# Patient Record
Sex: Female | Born: 1985 | Race: Black or African American | Hispanic: No | Marital: Married | Smoking: Never smoker
Health system: Southern US, Community
[De-identification: ages and names within clinical notes are randomized; demographics above are authoritative.]

## PROBLEM LIST (undated history)

## (undated) ENCOUNTER — Inpatient Hospital Stay (HOSPITAL_COMMUNITY): Payer: Self-pay

## (undated) DIAGNOSIS — R87629 Unspecified abnormal cytological findings in specimens from vagina: Secondary | ICD-10-CM

## (undated) DIAGNOSIS — R51 Headache: Secondary | ICD-10-CM

## (undated) DIAGNOSIS — R519 Headache, unspecified: Secondary | ICD-10-CM

## (undated) DIAGNOSIS — I10 Essential (primary) hypertension: Secondary | ICD-10-CM

## (undated) HISTORY — PX: WISDOM TOOTH EXTRACTION: SHX21

---

## 2017-03-13 ENCOUNTER — Other Ambulatory Visit: Payer: Self-pay | Admitting: Obstetrics & Gynecology

## 2017-03-13 ENCOUNTER — Other Ambulatory Visit (HOSPITAL_COMMUNITY)
Admission: RE | Admit: 2017-03-13 | Discharge: 2017-03-13 | Disposition: A | Discharge status: Home / Self Care | Payer: 59 | Source: Ambulatory Visit | Attending: Obstetrics and Gynecology | Admitting: Obstetrics and Gynecology

## 2017-03-13 DIAGNOSIS — Z3009 Encounter for other general counseling and advice on contraception: Secondary | ICD-10-CM | POA: Diagnosis not present

## 2017-03-13 DIAGNOSIS — Z01419 Encounter for gynecological examination (general) (routine) without abnormal findings: Secondary | ICD-10-CM | POA: Insufficient documentation

## 2017-03-14 LAB — CYTOLOGY - PAP: Diagnosis: NEGATIVE

## 2017-05-13 DIAGNOSIS — L308 Other specified dermatitis: Secondary | ICD-10-CM | POA: Diagnosis not present

## 2017-05-13 DIAGNOSIS — L740 Miliaria rubra: Secondary | ICD-10-CM | POA: Diagnosis not present

## 2017-07-29 DIAGNOSIS — F4320 Adjustment disorder, unspecified: Secondary | ICD-10-CM | POA: Diagnosis not present

## 2017-08-12 DIAGNOSIS — F4320 Adjustment disorder, unspecified: Secondary | ICD-10-CM | POA: Diagnosis not present

## 2017-08-20 ENCOUNTER — Encounter (HOSPITAL_COMMUNITY): Payer: Self-pay | Admitting: Emergency Medicine

## 2017-08-20 ENCOUNTER — Ambulatory Visit (HOSPITAL_COMMUNITY)
Admission: EM | Admit: 2017-08-20 | Discharge: 2017-08-20 | Disposition: A | Discharge status: Home / Self Care | Payer: 59 | Attending: Family Medicine | Admitting: Family Medicine

## 2017-08-20 DIAGNOSIS — R6889 Other general symptoms and signs: Secondary | ICD-10-CM

## 2017-08-20 DIAGNOSIS — J069 Acute upper respiratory infection, unspecified: Secondary | ICD-10-CM | POA: Diagnosis not present

## 2017-08-20 HISTORY — DX: Essential (primary) hypertension: I10

## 2017-08-20 NOTE — ED Triage Notes (Signed)
Pt sts URI sx with fever x 2 days

## 2017-08-21 NOTE — ED Provider Notes (Signed)
  [facility]   [id number] 08/20/17 Arrival Time: 1759  ASSESSMENT & PLAN:  1. Flu-like symptoms   2. Viral upper respiratory tract infection    Rest/fluids. OTC analgesics and symptom care as needed. May f/u as needed.  Reviewed expectations re: course of current medical issues. Questions answered. Outlined signs and symptoms indicating need for more acute intervention. Patient verbalized understanding. After Visit Summary given.   SUBJECTIVE:  Kara Cherry is a 31 y.o. female who presents with complaint of nasal congestion, post-nasal drainage, bodyaches, and coughing. Abrupt onset yesterday. Overall fatigued. Subjective fever. No SOB/wheezing. No OTC treatment.  ROS: As per HPI.   OBJECTIVE:  Vitals:   08/20/17 1819 08/20/17 1923  BP: (!) 144/100 (!) 136/92  Pulse: 66 77  Resp: 18   Temp: 98.8 F (37.1 C)   TempSrc: Oral   SpO2: 99%      General appearance: alert; no distress HEENT: nasal congestion; clear runny nose; throat irritation secondary to post-nasal drainage Neck: supple without LAD Lungs: clear to auscultation bilaterally Skin: warm and dry Psychological: alert and cooperative; normal mood and affect    Allergies  Allergen Reactions  . Sulfa Antibiotics     Past Medical History:  Diagnosis Date  . Hypertension    Social History   Social History  . Marital status: Married    Spouse name: N/A  . Number of children: N/A  . Years of education: N/A   Occupational History  . Not on file.   Social History Main Topics  . Smoking status: Never Smoker  . Smokeless tobacco: Never Used  . Alcohol use Yes     Comment: occ  . Drug use: No  . Sexual activity: Not on file   Other Topics Concern  . Not on file   Social History Narrative  . No narrative on file            Mardella Layman, MD 08/21/17 [phone]

## 2017-09-16 DIAGNOSIS — F4320 Adjustment disorder, unspecified: Secondary | ICD-10-CM | POA: Diagnosis not present

## 2017-10-07 ENCOUNTER — Other Ambulatory Visit: Payer: Self-pay

## 2017-10-07 ENCOUNTER — Encounter: Payer: Self-pay | Admitting: Family Medicine

## 2017-10-07 ENCOUNTER — Ambulatory Visit (INDEPENDENT_AMBULATORY_CARE_PROVIDER_SITE_OTHER): Payer: BLUE CROSS/BLUE SHIELD | Admitting: Family Medicine

## 2017-10-07 VITALS — BP 118/74 | HR 84 | Temp 98.5°F | Resp 17 | Ht 66.75 in | Wt 147.6 lb

## 2017-10-07 DIAGNOSIS — Z0000 Encounter for general adult medical examination without abnormal findings: Secondary | ICD-10-CM

## 2017-10-07 DIAGNOSIS — K582 Mixed irritable bowel syndrome: Secondary | ICD-10-CM

## 2017-10-07 DIAGNOSIS — R002 Palpitations: Secondary | ICD-10-CM | POA: Diagnosis not present

## 2017-10-07 DIAGNOSIS — Z13220 Encounter for screening for lipoid disorders: Secondary | ICD-10-CM

## 2017-10-07 NOTE — Patient Instructions (Addendum)

## 2017-10-07 NOTE — Progress Notes (Signed)
Chief Complaint  Patient presents with  . Annual Exam    no pap    Subjective:  Kara Cherry is a 31 y.o. female here for a health maintenance visit.  Patient is new pt  There are no active problems to display for this patient.   Past Medical History:  Diagnosis Date  . Hypertension     History reviewed. No pertinent surgical history.   Outpatient Medications Prior to Visit  Medication Sig Dispense Refill  . Cyanocobalamin (VITAMIN B 12 PO) Take 5,000 mcg/day by mouth.    . Prenatal Vit-Fe Fumarate-FA (MULTIVITAMIN-PRENATAL) 27-0.8 MG TABS tablet Take 1 tablet by mouth daily at 12 noon.     No facility-administered medications prior to visit.     Allergies  Allergen Reactions  . Sulfa Antibiotics      Family History  Problem Relation Age of Onset  . Hypertension Mother   . Hypertension Father   . Heart disease Paternal Grandmother   . Stroke Paternal Grandmother   . Diabetes Paternal Grandfather   . Heart disease Paternal Grandfather      Health Habits: Dental Exam: up to date Eye Exam: up to date Exercise: 2 times/week on average Current exercise activities: walking/running Diet: balanced  Social History   Socioeconomic History  . Marital status: Married    Spouse name: Not on file  . Number of children: Not on file  . Years of education: Not on file  . Highest education level: Not on file  Social Needs  . Financial resource strain: Not on file  . Food insecurity - worry: Not on file  . Food insecurity - inability: Not on file  . Transportation needs - medical: Not on file  . Transportation needs - non-medical: Not on file  Occupational History  . Not on file  Tobacco Use  . Smoking status: Never Smoker  . Smokeless tobacco: Never Used  Substance and Sexual Activity  . Alcohol use: Yes    Comment: occ  . Drug use: No  . Sexual activity: Yes  Other Topics Concern  . Not on file  Social History Narrative  . Not on file   Social  History   Substance and Sexual Activity  Alcohol Use Yes   Comment: occ   Social History   Tobacco Use  Smoking Status Never Smoker  Smokeless Tobacco Never Used   Social History   Substance and Sexual Activity  Drug Use No    GYN: Sexual Health Menstrual status: regular menses LMP: Patient's last menstrual period was 09/27/2017. Last pap smear: see HM section History of abnormal pap smears:  Sexually active: with female partner Current contraception: none  Health Maintenance: See under health Maintenance activity for review of completion dates as well.  There is no immunization history on file for this patient.    Depression Screen-PHQ2/9 Depression screen [facility] 2/9 10/07/2017  Decreased Interest 0  Down, Depressed, Hopeless 0  PHQ - 2 Score 0    Depression Severity and Treatment Recommendations:  0-4= None  5-9= Mild / Treatment: Support, educate to call if worse; return in one month  10-14= Moderate / Treatment: Support, watchful waiting; Antidepressant or Psycotherapy  15-19= Moderately severe / Treatment: Antidepressant OR Psychotherapy  >= 20 = Major depression, severe / Antidepressant AND Psychotherapy    Review of Systems   Review of Systems  Constitutional: Negative for chills and fever.  HENT: Negative for hearing loss and tinnitus.   Eyes: Negative for blurred vision, double  vision and photophobia.  Respiratory: Negative for cough, shortness of breath and wheezing.   Cardiovascular: Positive for palpitations. Negative for chest pain.  Gastrointestinal: Positive for constipation, diarrhea and nausea. Negative for abdominal pain, blood in stool, heartburn, melena and vomiting.  Genitourinary: Negative for dysuria, frequency and urgency.  Skin: Negative for itching and rash.  Neurological: Negative for dizziness, tingling, tremors and headaches.  Psychiatric/Behavioral: Negative for depression. The patient is not nervous/anxious.     See HPI for ROS  as well.    Objective:   Vitals:   10/07/17 1050  BP: 118/74  Pulse: 84  Resp: 17  Temp: 98.5 F (36.9 C)  TempSrc: Oral  SpO2: 99%  Weight: 147 lb 9.6 oz (67 kg)  Height: 5' 6.75" (1.695 m)    Body mass index is 23.29 kg/m.  Physical Exam  BP 118/74 (BP Location: Left Arm, Patient Position: Sitting, Cuff Size: Normal)   Pulse 84   Temp 98.5 F (36.9 C) (Oral)   Resp 17   Ht 5' 6.75" (1.695 m)   Wt 147 lb 9.6 oz (67 kg)   LMP 09/27/2017   SpO2 99%   BMI 23.29 kg/m   General Appearance:    Alert, cooperative, no distress, appears stated age  Head:    Normocephalic, without obvious abnormality, atraumatic  Eyes:    PERRL, conjunctiva/corneas clear, EOM's intact, fundi    benign, both eyes  Ears:    Normal TM's and external ear canals, both ears  Nose:   Nares normal, septum midline, mucosa normal, no drainage    or sinus tenderness  Throat:   Lips, mucosa, and tongue normal; teeth and gums normal  Neck:   Supple, symmetrical, trachea midline, no adenopathy;    thyroid:  no enlargement/tenderness/nodules; no carotid   bruit or JVD  Back:     Symmetric, no curvature, ROM normal, no CVA tenderness  Lungs:     Clear to auscultation bilaterally, respirations unlabored  Chest Wall:    No tenderness or deformity   Heart:    Regular rate and rhythm, S1 and S2 normal, no murmur, rub   or gallop  Breast Exam:    No tenderness, masses, or nipple abnormality  Abdomen:     Soft, non-tender, bowel sounds active all four quadrants,    no masses, no organomegaly  Extremities:   Extremities normal, atraumatic, no cyanosis or edema  Pulses:   2+ and symmetric all extremities  Skin:   Skin color, texture, turgor normal, no rashes or lesions  Lymph nodes:   Cervical, supraclavicular, and axillary nodes normal  Neurologic:   CNII-XII intact, normal strength, sensation and reflexes    throughout   ECG - normal sinus rhythm   Assessment/Plan:   Patient was seen for a health  maintenance exam.  Counseled the patient on health maintenance issues. Reviewed her health mainteance schedule and ordered appropriate tests (see orders.) Counseled on regular exercise and weight management. Recommend regular eye exams and dental cleaning.   The following issues were addressed today for health maintenance:   Lakiya was seen today for annual exam.  Diagnoses and all orders for this visit:  Encounter for health maintenance examination in adult- age appropriate screenings reviewed  Irritable bowel syndrome with both constipation and diarrhea- advised pt to keep a food diary -     TSH -     Basic metabolic panel  Screening, lipid -     Lipid panel  Palpitations- normal ekg, advised  increased hydration and decreased caffeine  Will check for anemia  -     EKG 12-Lead -     CBC    Return in about 1 year (around 10/07/2018).    Body mass index is 23.29 kg/m.:  Discussed the patient's BMI with patient. The BMI body mass index is 23.29 kg/m.     No future appointments.  Patient Instructions  Health Maintenance, Female Adopting a healthy lifestyle and getting preventive care can go a long way to promote health and wellness. Talk with your health care provider about what schedule of regular examinations is right for you. This is a good chance for you to check in with your provider about disease prevention and staying healthy. In between checkups, there are plenty of things you can do on your own. Experts have done a lot of research about which lifestyle changes and preventive measures are most likely to keep you healthy. Ask your health care provider for more information. Weight and diet Eat a healthy diet  Be sure to include plenty of vegetables, fruits, low-fat dairy products, and lean protein.  Do not eat a lot of foods high in solid fats, added sugars, or salt.  Get regular exercise. This is one of the most important things you can do for your health. ? Most  adults should exercise for at least 150 minutes each week. The exercise should increase your heart rate and make you sweat (moderate-intensity exercise). ? Most adults should also do strengthening exercises at least twice a week. This is in addition to the moderate-intensity exercise.  Maintain a healthy weight  Body mass index (BMI) is a measurement that can be used to identify possible weight problems. It estimates body fat based on height and weight. Your health care provider can help determine your BMI and help you achieve or maintain a healthy weight.  For females 77 years of age and older: ? A BMI below 18.5 is considered underweight. ? A BMI of 18.5 to 24.9 is normal. ? A BMI of 25 to 29.9 is considered overweight. ? A BMI of 30 and above is considered obese.  Watch levels of cholesterol and blood lipids  You should start having your blood tested for lipids and cholesterol at 29 years of age, then have this test every 5 years.  You may need to have your cholesterol levels checked more often if: ? Your lipid or cholesterol levels are high. ? You are older than 29 years of age. ? You are at high risk for heart disease.  Cancer screening Lung Cancer  Lung cancer screening is recommended for adults 23-47 years old who are at high risk for lung cancer because of a history of smoking.  A yearly low-dose CT scan of the lungs is recommended for people who: ? Currently smoke. ? Have quit within the past 15 years. ? Have at least a 30-pack-year history of smoking. A pack year is smoking an average of one pack of cigarettes a day for 1 year.  Yearly screening should continue until it has been 15 years since you quit.  Yearly screening should stop if you develop a health problem that would prevent you from having lung cancer treatment.  Breast Cancer  Practice breast self-awareness. This means understanding how your breasts normally appear and feel.  It also means doing regular  breast self-exams. Let your health care provider know about any changes, no matter how small.  If you are in your 20s or 30s,  you should have a clinical breast exam (CBE) by a health care provider every 1-3 years as part of a regular health exam.  If you are 70 or older, have a CBE every year. Also consider having a breast X-ray (mammogram) every year.  If you have a family history of breast cancer, talk to your health care provider about genetic screening.  If you are at high risk for breast cancer, talk to your health care provider about having an MRI and a mammogram every year.  Breast cancer gene (BRCA) assessment is recommended for women who have family members with BRCA-related cancers. BRCA-related cancers include: ? Breast. ? Ovarian. ? Tubal. ? Peritoneal cancers.  Results of the assessment will determine the need for genetic counseling and BRCA1 and BRCA2 testing.  Cervical Cancer Your health care provider may recommend that you be screened regularly for cancer of the pelvic organs (ovaries, uterus, and vagina). This screening involves a pelvic examination, including checking for microscopic changes to the surface of your cervix (Pap test). You may be encouraged to have this screening done every 3 years, beginning at age 15.  For women ages 18-65, health care providers may recommend pelvic exams and Pap testing every 3 years, or they may recommend the Pap and pelvic exam, combined with testing for human papilloma virus (HPV), every 5 years. Some types of HPV increase your risk of cervical cancer. Testing for HPV may also be done on women of any age with unclear Pap test results.  Other health care providers may not recommend any screening for nonpregnant women who are considered low risk for pelvic cancer and who do not have symptoms. Ask your health care provider if a screening pelvic exam is right for you.  If you have had past treatment for cervical cancer or a condition that  could lead to cancer, you need Pap tests and screening for cancer for at least 20 years after your treatment. If Pap tests have been discontinued, your risk factors (such as having a new sexual partner) need to be reassessed to determine if screening should resume. Some women have medical problems that increase the chance of getting cervical cancer. In these cases, your health care provider may recommend more frequent screening and Pap tests.  Colorectal Cancer  This type of cancer can be detected and often prevented.  Routine colorectal cancer screening usually begins at 29 years of age and continues through 29 years of age.  Your health care provider may recommend screening at an earlier age if you have risk factors for colon cancer.  Your health care provider may also recommend using home test kits to check for hidden blood in the stool.  A small camera at the end of a tube can be used to examine your colon directly (sigmoidoscopy or colonoscopy). This is done to check for the earliest forms of colorectal cancer.  Routine screening usually begins at age 59.  Direct examination of the colon should be repeated every 5-10 years through 28 years of age. However, you may need to be screened more often if early forms of precancerous polyps or small growths are found.  Skin Cancer  Check your skin from head to toe regularly.  Tell your health care provider about any new moles or changes in moles, especially if there is a change in a mole's shape or color.  Also tell your health care provider if you have a mole that is larger than the size of a pencil eraser.  Always use sunscreen. Apply sunscreen liberally and repeatedly throughout the day.  Protect yourself by wearing long sleeves, pants, a wide-brimmed hat, and sunglasses whenever you are outside.  Heart disease, diabetes, and high blood pressure  High blood pressure causes heart disease and increases the risk of stroke. High blood  pressure is more likely to develop in: ? People who have blood pressure in the high end of the normal range (130-139/85-89 mm Hg). ? People who are overweight or obese. ? People who are African American.  If you are 7-79 years of age, have your blood pressure checked every 3-5 years. If you are 55 years of age or older, have your blood pressure checked every year. You should have your blood pressure measured twice-once when you are at a hospital or clinic, and once when you are not at a hospital or clinic. Record the average of the two measurements. To check your blood pressure when you are not at a hospital or clinic, you can use: ? An automated blood pressure machine at a pharmacy. ? A home blood pressure monitor.  If you are between 45 years and 28 years old, ask your health care provider if you should take aspirin to prevent strokes.  Have regular diabetes screenings. This involves taking a blood sample to check your fasting blood sugar level. ? If you are at a normal weight and have a low risk for diabetes, have this test once every three years after 29 years of age. ? If you are overweight and have a high risk for diabetes, consider being tested at a younger age or more often. Preventing infection Hepatitis B  If you have a higher risk for hepatitis B, you should be screened for this virus. You are considered at high risk for hepatitis B if: ? You were born in a country where hepatitis B is common. Ask your health care provider which countries are considered high risk. ? Your parents were born in a high-risk country, and you have not been immunized against hepatitis B (hepatitis B vaccine). ? You have HIV or AIDS. ? You use needles to inject street drugs. ? You live with someone who has hepatitis B. ? You have had sex with someone who has hepatitis B. ? You get hemodialysis treatment. ? You take certain medicines for conditions, including cancer, organ transplantation, and autoimmune  conditions.  Hepatitis C  Blood testing is recommended for: ? Everyone born from [birth year] through 1963. ? Anyone with known risk factors for hepatitis C.  Sexually transmitted infections (STIs)  You should be screened for sexually transmitted infections (STIs) including gonorrhea and chlamydia if: ? You are sexually active and are younger than 29 years of age. ? You are older than 29 years of age and your health care provider tells you that you are at risk for this type of infection. ? Your sexual activity has changed since you were last screened and you are at an increased risk for chlamydia or gonorrhea. Ask your health care provider if you are at risk.  If you do not have HIV, but are at risk, it may be recommended that you take a prescription medicine daily to prevent HIV infection. This is called pre-exposure prophylaxis (PrEP). You are considered at risk if: ? You are sexually active and do not regularly use condoms or know the HIV status of your partner(s). ? You take drugs by injection. ? You are sexually active with a partner who has HIV.  Talk with  your health care provider about whether you are at high risk of being infected with HIV. If you choose to begin PrEP, you should first be tested for HIV. You should then be tested every 3 months for as long as you are taking PrEP. Pregnancy  If you are premenopausal and you may become pregnant, ask your health care provider about preconception counseling.  If you may become pregnant, take 400 to 800 micrograms (mcg) of folic acid every day.  If you want to prevent pregnancy, talk to your health care provider about birth control (contraception). Osteoporosis and menopause  Osteoporosis is a disease in which the bones lose minerals and strength with aging. This can result in serious bone fractures. Your risk for osteoporosis can be identified using a bone density scan.  If you are 55 years of age or older, or if you are at risk for  osteoporosis and fractures, ask your health care provider if you should be screened.  Ask your health care provider whether you should take a calcium or vitamin D supplement to lower your risk for osteoporosis.  Menopause may have certain physical symptoms and risks.  Hormone replacement therapy may reduce some of these symptoms and risks. Talk to your health care provider about whether hormone replacement therapy is right for you. Follow these instructions at home:  Schedule regular health, dental, and eye exams.  Stay current with your immunizations.  Do not use any tobacco products including cigarettes, chewing tobacco, or electronic cigarettes.  If you are pregnant, do not drink alcohol.  If you are breastfeeding, limit how much and how often you drink alcohol.  Limit alcohol intake to no more than 1 drink per day for nonpregnant women. One drink equals 12 ounces of beer, 5 ounces of wine, or 1 ounces of hard liquor.  Do not use street drugs.  Do not share needles.  Ask your health care provider for help if you need support or information about quitting drugs.  Tell your health care provider if you often feel depressed.  Tell your health care provider if you have ever been abused or do not feel safe at home. This information is not intended to replace advice given to you by your health care provider. Make sure you discuss any questions you have with your health care provider. Document Released: 05/21/2011 Document Revised: 04/12/2016 Document Reviewed: 08/09/2015 Elsevier Interactive Patient Education  [employer].

## 2017-10-08 LAB — CBC
Hematocrit: 41.9 % (ref 34.0–46.6)
Hemoglobin: 14.3 g/dL (ref 11.1–15.9)
MCH: 29.2 pg (ref 26.6–33.0)
MCHC: 34.1 g/dL (ref 31.5–35.7)
MCV: 86 fL (ref 79–97)
Platelets: 220 10*3/uL (ref 150–379)
RBC: 4.89 x10E6/uL (ref 3.77–5.28)
RDW: 13.9 % (ref 12.3–15.4)
WBC: 5.1 10*3/uL (ref 3.4–10.8)

## 2017-10-08 LAB — BASIC METABOLIC PANEL
BUN/Creatinine Ratio: 10 (ref 9–23)
BUN: 10 mg/dL (ref 6–20)
CO2: 25 mmol/L (ref 20–29)
Calcium: 9.7 mg/dL (ref 8.7–10.2)
Chloride: 104 mmol/L (ref 96–106)
Creatinine, Ser: 1.01 mg/dL — ABNORMAL HIGH (ref 0.57–1.00)
GFR calc Af Amer: 87 mL/min/{1.73_m2} (ref >59)
GFR calc non Af Amer: 75 mL/min/{1.73_m2} (ref >59)
Glucose: 85 mg/dL (ref 65–99)
Potassium: 4.9 mmol/L (ref 3.5–5.2)
Sodium: 143 mmol/L (ref 134–144)

## 2017-10-08 LAB — LIPID PANEL
Chol/HDL Ratio: 2.9 ratio (ref 0.0–4.4)
Cholesterol, Total: 177 mg/dL (ref 100–199)
HDL: 61 mg/dL (ref >39)
LDL Calculated: 101 mg/dL — ABNORMAL HIGH (ref 0–99)
Triglycerides: 73 mg/dL (ref 0–149)
VLDL Cholesterol Cal: 15 mg/dL (ref 5–40)

## 2017-10-08 LAB — TSH: TSH: 0.798 u[IU]/mL (ref 0.450–4.500)

## 2017-10-14 DIAGNOSIS — F4320 Adjustment disorder, unspecified: Secondary | ICD-10-CM | POA: Diagnosis not present

## 2017-10-31 DIAGNOSIS — F4320 Adjustment disorder, unspecified: Secondary | ICD-10-CM | POA: Diagnosis not present

## 2017-12-12 DIAGNOSIS — F4320 Adjustment disorder, unspecified: Secondary | ICD-10-CM | POA: Diagnosis not present

## 2018-02-06 DIAGNOSIS — R350 Frequency of micturition: Secondary | ICD-10-CM | POA: Diagnosis not present

## 2018-02-13 DIAGNOSIS — R102 Pelvic and perineal pain: Secondary | ICD-10-CM | POA: Diagnosis not present

## 2018-07-03 ENCOUNTER — Other Ambulatory Visit: Payer: Self-pay

## 2018-07-03 ENCOUNTER — Ambulatory Visit: Payer: 59 | Admitting: Physician Assistant

## 2018-07-03 VITALS — BP 135/95 | HR 75 | Temp 98.9°F | Resp 16 | Ht 66.0 in | Wt 156.0 lb

## 2018-07-03 DIAGNOSIS — L700 Acne vulgaris: Secondary | ICD-10-CM

## 2018-07-03 MED ORDER — CLINDAMYCIN PHOS-BENZOYL PEROX 1-5 % EX GEL: Freq: Two times a day (BID) | CUTANEOUS | 11 refills | Status: DC

## 2018-07-03 NOTE — Patient Instructions (Signed)
  I will contact you with your lab results within the next 2 weeks.  If you have not heard from us then please contact us. The fastest way to get your results is to register for My Chart.   IF you received an x-ray today, you will receive an invoice from [facility]. Please contact [facility] at [phone] with questions or concerns regarding your invoice.   IF you received labwork today, you will receive an invoice from [facility]. Please contact [facility] at [phone] with questions or concerns regarding your invoice.   Our billing staff will not be able to assist you with questions regarding bills from these companies.  You will be contacted with the lab results as soon as they are available. The fastest way to get your results is to activate your My Chart account. Instructions are located on the last page of this paperwork. If you have not heard from us regarding the results in 2 weeks, please contact this office.     

## 2018-07-03 NOTE — Progress Notes (Signed)
07/04/2018 9:19 AM   DOB: 08/20/1986 / MRN: [id number]  SUBJECTIVE:  Kara Cherry is a 32 y.o. female presenting for acne treatment. Symptoms present for years.  The problem is waxing and waning.  She has tried several OTC including benzoil peroxide. She is try to become pregnant.  She wants to try clindamycin/benzoil peroxide gel.  She is allergic to sulfa antibiotics.   She  has a past medical history of Hypertension.    She  reports that she has never smoked. She has never used smokeless tobacco. She reports that she drinks alcohol. She reports that she does not use drugs. She  reports that she currently engages in sexual activity. The patient  has no past surgical history on file.  Her family history includes Diabetes in her paternal grandfather; Heart disease in her paternal grandfather and paternal grandmother; Hypertension in her father and mother; Stroke in her paternal grandmother.  Review of Systems  Skin: Positive for rash. Negative for itching.    The problem list and medications were reviewed and updated by myself where necessary and exist elsewhere in the encounter.   OBJECTIVE:  BP (!) 135/95   Pulse 75   Temp 98.9 F (37.2 C) (Oral)   Resp 16   Ht 5\' 6"  (1.676 m)   Wt 156 lb (70.8 kg)   LMP 06/14/2018   SpO2 98%   BMI 25.18 kg/m   Wt Readings from Last 3 Encounters:  07/03/18 156 lb (70.8 kg)  10/07/17 147 lb 9.6 oz (67 kg)   Temp Readings from Last 3 Encounters:  07/03/18 98.9 F (37.2 C) (Oral)  10/07/17 98.5 F (36.9 C) (Oral)  08/20/17 98.8 F (37.1 C) (Oral)   BP Readings from Last 3 Encounters:  07/03/18 (!) 135/95  10/07/17 118/74  08/20/17 (!) 136/92   Pulse Readings from Last 3 Encounters:  07/03/18 75  10/07/17 84  08/20/17 77    Physical Exam  Constitutional: She is oriented to person, place, and time. She appears well-nourished. No distress.  Eyes: Pupils are equal, round, and reactive to light. EOM are normal.    Cardiovascular: Normal rate.  Pulmonary/Chest: Effort normal.  Abdominal: She exhibits no distension.  Neurological: She is alert and oriented to person, place, and time. No cranial nerve deficit. Gait normal.  Skin: Skin is dry. She is not diaphoretic.  Psychiatric: She has a normal mood and affect.  Vitals reviewed.        No results found for: HGBA1C  Lab Results  Component Value Date   WBC 5.1 10/07/2017   HGB 14.3 10/07/2017   HCT 41.9 10/07/2017   MCV 86 10/07/2017   PLT 220 10/07/2017    Lab Results  Component Value Date   CREATININE 1.01 (H) 10/07/2017   BUN 10 10/07/2017   NA 143 10/07/2017   K 4.9 10/07/2017   CL 104 10/07/2017   CO2 25 10/07/2017    No results found for: ALT, AST, GGT, ALKPHOS, BILITOT  Lab Results  Component Value Date   TSH 0.798 10/07/2017    Lab Results  Component Value Date   CHOL 177 10/07/2017   HDL 61 10/07/2017   LDLCALC 101 (H) 10/07/2017   TRIG 73 10/07/2017   CHOLHDL 2.9 10/07/2017     ASSESSMENT AND PLAN:  Leeroy BockChelsea was seen today for acne.  Diagnoses and all orders for this visit:  Acne vulgaris -     clindamycin-benzoyl peroxide (BENZACLIN) gel; Apply topically 2 (two) times  daily. -     Ambulatory referral to Dermatology    The patient is advised to call or return to clinic if she does not see an improvement in symptoms, or to seek the care of the closest emergency department if she worsens with the above plan.   Deliah BostonMichael Analyse Angst, MHS, PA-C Primary Care at [facility] 07/04/2018 9:19 AM

## 2018-07-07 ENCOUNTER — Telehealth: Payer: Self-pay | Admitting: Physician Assistant

## 2018-07-07 NOTE — Telephone Encounter (Signed)
The pt's RX for Clindamycin-Benzoyl Perox 1-5% was rejected. Notes are in box.  Please advise.  Thank you!

## 2018-07-16 ENCOUNTER — Other Ambulatory Visit: Payer: Self-pay | Admitting: Physician Assistant

## 2018-07-16 MED ORDER — CLINDAMYCIN PHOSPHATE 1 % EX GEL: Freq: Two times a day (BID) | CUTANEOUS | 11 refills | Status: DC

## 2018-07-16 NOTE — Telephone Encounter (Signed)
I will send in for just the clindamycin gel.  Maybe that will get paid for. Please make her aware. Deliah BostonMichael Clark, MS, PA-C 2:05 PM, 07/16/2018

## 2018-07-17 DIAGNOSIS — Z3009 Encounter for other general counseling and advice on contraception: Secondary | ICD-10-CM | POA: Diagnosis not present

## 2018-07-17 DIAGNOSIS — N926 Irregular menstruation, unspecified: Secondary | ICD-10-CM | POA: Diagnosis not present

## 2018-07-17 DIAGNOSIS — Z3201 Encounter for pregnancy test, result positive: Secondary | ICD-10-CM | POA: Diagnosis not present

## 2018-07-31 DIAGNOSIS — Z3401 Encounter for supervision of normal first pregnancy, first trimester: Secondary | ICD-10-CM | POA: Diagnosis not present

## 2018-07-31 DIAGNOSIS — Z3400 Encounter for supervision of normal first pregnancy, unspecified trimester: Secondary | ICD-10-CM | POA: Diagnosis not present

## 2018-07-31 DIAGNOSIS — Z3201 Encounter for pregnancy test, result positive: Secondary | ICD-10-CM | POA: Diagnosis not present

## 2018-07-31 LAB — HIV ANTIBODY (ROUTINE TESTING W REFLEX): HIV Screen 4th Generation wRfx: NONREACTIVE

## 2018-07-31 LAB — OB RESULTS CONSOLE HEPATITIS B SURFACE ANTIGEN: Hepatitis B Surface Ag: NEGATIVE

## 2018-07-31 LAB — OB RESULTS CONSOLE RUBELLA ANTIBODY, IGM: Rubella: IMMUNE

## 2018-08-01 ENCOUNTER — Telehealth: Payer: Self-pay | Admitting: Physician Assistant

## 2018-08-01 NOTE — Telephone Encounter (Signed)
Patient was denied her medication (Clindamycin-Benzoyl) by her insurance company. Denial letter is in Dr. Adela GlimpseSantiago's box.

## 2018-08-25 DIAGNOSIS — N898 Other specified noninflammatory disorders of vagina: Secondary | ICD-10-CM | POA: Diagnosis not present

## 2018-08-25 DIAGNOSIS — Z3401 Encounter for supervision of normal first pregnancy, first trimester: Secondary | ICD-10-CM | POA: Diagnosis not present

## 2018-08-25 DIAGNOSIS — Z23 Encounter for immunization: Secondary | ICD-10-CM | POA: Diagnosis not present

## 2018-08-25 LAB — OB RESULTS CONSOLE GC/CHLAMYDIA
Chlamydia: NEGATIVE
Gonorrhea: NEGATIVE

## 2018-08-27 DIAGNOSIS — Z3A10 10 weeks gestation of pregnancy: Secondary | ICD-10-CM | POA: Diagnosis not present

## 2018-09-25 DIAGNOSIS — Z3402 Encounter for supervision of normal first pregnancy, second trimester: Secondary | ICD-10-CM | POA: Diagnosis not present

## 2018-10-23 DIAGNOSIS — Z3402 Encounter for supervision of normal first pregnancy, second trimester: Secondary | ICD-10-CM | POA: Diagnosis not present

## 2018-11-17 ENCOUNTER — Telehealth: Payer: Self-pay | Admitting: Family Medicine

## 2018-11-17 NOTE — Telephone Encounter (Signed)
Copied from CRM [phone]. Topic: Quick Communication - Rx Refill/Question >> Nov 17, 2018  4:09 PM Darletta MollLander, Lumin L wrote: Medication: clindamycin (CLINDAGEL) 1 % gel   Has the patient contacted their pharmacy? Yes.   (Agent: If no, request that the patient contact the pharmacy for the refill.) (Agent: If yes, when and what did the pharmacy advise?) CONTACTED OFFICE FOR PA ALREADY  Preferred Pharmacy (with phone number or street name): [id number] [place], [place] - [address] [place] [id number] Phone: [phone] Fax: [phone]  Agent: Please be advised that RX refills may take up to 3 business days. We ask that you follow-up with your pharmacy.

## 2018-11-29 ENCOUNTER — Other Ambulatory Visit: Payer: Self-pay

## 2018-11-29 ENCOUNTER — Ambulatory Visit (HOSPITAL_COMMUNITY)
Admission: EM | Admit: 2018-11-29 | Discharge: 2018-11-29 | Disposition: A | Discharge status: Home / Self Care | Payer: 59 | Attending: Family Medicine | Admitting: Family Medicine

## 2018-11-29 ENCOUNTER — Encounter (HOSPITAL_COMMUNITY): Payer: Self-pay | Admitting: Emergency Medicine

## 2018-11-29 DIAGNOSIS — J069 Acute upper respiratory infection, unspecified: Secondary | ICD-10-CM

## 2018-11-29 DIAGNOSIS — B9789 Other viral agents as the cause of diseases classified elsewhere: Secondary | ICD-10-CM

## 2018-11-29 NOTE — Discharge Instructions (Signed)
You likely having a viral upper respiratory infection. We recommended symptom control. I expect your symptoms to start improving in the next 1-2 weeks.   1. Take a daily allergy pill/anti-histamine like Zyrtec, Claritin, or Store brand consistently for 2 weeks  2. For congestion you may try an oral decongestant like Mucinex or sudafed. You may also try intranasal flonase nasal spray or saline irrigations (neti pot, sinus cleanse)  3. For your sore throat you may try cepacol lozenges, salt water gargles, throat spray. Treatment of congestion may also help your sore throat.  4. For cough you may try Robitussen, Mucinex DM  5. Take Tylenol  to help with fever/pain/inflammation  6. Stay hydrated, drink plenty of fluids to keep throat coated and less irritated  Honey Tea For cough/sore throat try using a honey-based tea. Use 3 teaspoons of honey with juice squeezed from half lemon. Place shaved pieces of ginger into 1/2-1 cup of water and warm over stove top. Then mix the ingredients and repeat every 4 hours as needed.

## 2018-11-29 NOTE — ED Triage Notes (Signed)
The patient presented to the [facility] with a complaint of a cough and congestion x 4 days.

## 2018-11-30 NOTE — ED Provider Notes (Signed)
[facility]    CSN: [id number] Arrival date & time: 11/29/18  1341     History   Chief Complaint Chief Complaint  Patient presents with  . Cough    HPI Kara Cherry is a 33 y.o. female currently [redacted] weeks pregnant presenting today for evaluation of URI symptoms.  Patient has had cough and congestion for approximately 4 days.  She noted a fever initially with associated body aches.  Since these have resolved, but feels that slightly returned today.  She is been taking Robitussin, Tylenol and Zicam.  She denies any shortness of breath or chest discomfort.  HPI  Past Medical History:  Diagnosis Date  . Hypertension     There are no active problems to display for this patient.   History reviewed. No pertinent surgical history.  OB History    Gravida  1   Para      Term      Preterm      AB      Living        SAB      TAB      Ectopic      Multiple      Live Births               Home Medications    Prior to Admission medications   Medication Sig Start Date End Date Taking? Authorizing Provider  Prenatal Vit-Fe Fumarate-FA (MULTIVITAMIN-PRENATAL) 27-0.8 MG TABS tablet Take 1 tablet by mouth daily at 12 noon.   Yes [provider]    Family History Family History  Problem Relation Age of Onset  . Hypertension Mother   . Hypertension Father   . Heart disease Paternal Grandmother   . Stroke Paternal Grandmother   . Diabetes Paternal Grandfather   . Heart disease Paternal Grandfather     Social History Social History   Tobacco Use  . Smoking status: Never Smoker  . Smokeless tobacco: Never Used  Substance Use Topics  . Alcohol use: Yes    Comment: occ  . Drug use: No     Allergies   Sulfa antibiotics   Review of Systems Review of Systems  Constitutional: Positive for fatigue. Negative for activity change, appetite change, chills and fever.  HENT: Positive for congestion and rhinorrhea. Negative for ear  pain, sinus pressure, sore throat and trouble swallowing.   Eyes: Negative for discharge and redness.  Respiratory: Positive for cough. Negative for chest tightness and shortness of breath.   Cardiovascular: Negative for chest pain.  Gastrointestinal: Negative for abdominal pain, diarrhea, nausea and vomiting.  Musculoskeletal: Negative for myalgias.  Skin: Negative for rash.  Neurological: Negative for dizziness, light-headedness and headaches.     Physical Exam Triage Vital Signs ED Triage Vitals  Enc Vitals Group     BP 11/29/18 1455 (!) 146/88     Pulse Rate 11/29/18 1455 (!) 116     Resp 11/29/18 1455 18     Temp 11/29/18 1455 99.9 F (37.7 C)     Temp Source 11/29/18 1455 Oral     SpO2 11/29/18 1455 100 %     Weight --      Height --      Head Circumference --      Peak Flow --      Pain Score 11/29/18 1453 0     Pain Loc --      Pain Edu? --      Excl. in GC? --  No data found.  Updated Vital Signs BP (!) 146/88 (BP Location: Right Arm)   Pulse (!) 116   Temp 99.9 F (37.7 C) (Oral)   Resp 18   LMP 06/14/2018   SpO2 100%  Blood pressure rechecked 119/85 Visual Acuity Right Eye Distance:   Left Eye Distance:   Bilateral Distance:    Right Eye Near:   Left Eye Near:    Bilateral Near:     Physical Exam Vitals signs and nursing note reviewed.  Constitutional:      General: She is not in acute distress.    Appearance: She is well-developed.  HENT:     Head: Normocephalic and atraumatic.     Ears:     Comments: Bilateral ears without tenderness to palpation of external auricle, tragus and mastoid, EAC's without erythema or swelling, TM's with good bony landmarks and cone of light. Non erythematous.    Mouth/Throat:     Comments: Oral mucosa pink and moist, no tonsillar enlargement or exudate. Posterior pharynx patent and nonerythematous, no uvula deviation or swelling. Normal phonation. Eyes:     Conjunctiva/sclera: Conjunctivae normal.  Neck:      Musculoskeletal: Neck supple.  Cardiovascular:     Rate and Rhythm: Regular rhythm. Tachycardia present.     Heart sounds: No murmur.  Pulmonary:     Effort: Pulmonary effort is normal. No respiratory distress.     Breath sounds: Normal breath sounds.     Comments: Breathing comfortably at rest, CTABL, no wheezing, rales or other adventitious sounds auscultated Abdominal:     Palpations: Abdomen is soft.     Tenderness: There is no abdominal tenderness.  Skin:    General: Skin is warm and dry.  Neurological:     Mental Status: She is alert.      UC Treatments / Results  Labs (all labs ordered are listed, but only abnormal results are displayed) Labs Reviewed - No data to display  EKG None  Radiology No results found.  Procedures Procedures (including critical care time)  Medications Ordered in UC Medications - No data to display  Initial Impression / Assessment and Plan / UC Course  I have reviewed the triage vital signs and the nursing notes.  Pertinent labs & imaging results that were available during my care of the patient were reviewed by me and considered in my medical decision making (see chart for details).     Patient with 4 days of URI symptoms, most likely viral etiology, patient has low-grade temp and tachycardic.  Patient outside of Tamiflu window.  Discussed with patient possible flu.  Recommend continued symptomatic and supportive management.  Continue to keep a close eye on temperature, breathing, symptoms.  Provided list of medicines safe to take in pregnancy.Discussed strict return precautions. Patient verbalized understanding and is agreeable with plan.  Final Clinical Impressions(s) / UC Diagnoses   Final diagnoses:  Viral URI with cough     Discharge Instructions     You likely having a viral upper respiratory infection. We recommended symptom control. I expect your symptoms to start improving in the next 1-2 weeks.   1. Take a daily allergy  pill/anti-histamine like Zyrtec, Claritin, or Store brand consistently for 2 weeks  2. For congestion you may try an oral decongestant like Mucinex or sudafed. You may also try intranasal flonase nasal spray or saline irrigations (neti pot, sinus cleanse)  3. For your sore throat you may try cepacol lozenges, salt water gargles, throat spray.  Treatment of congestion may also help your sore throat.  4. For cough you may try Robitussen, Mucinex DM  5. Take Tylenol  to help with fever/pain/inflammation  6. Stay hydrated, drink plenty of fluids to keep throat coated and less irritated  Honey Tea For cough/sore throat try using a honey-based tea. Use 3 teaspoons of honey with juice squeezed from half lemon. Place shaved pieces of ginger into 1/2-1 cup of water and warm over stove top. Then mix the ingredients and repeat every 4 hours as needed.   ED Prescriptions    None     Controlled Substance Prescriptions [place] Controlled Substance Registry consulted? Not Applicable   Lew Dawes, [place] 11/30/18 1006

## 2018-12-01 ENCOUNTER — Encounter (HOSPITAL_COMMUNITY): Payer: Self-pay

## 2018-12-01 ENCOUNTER — Inpatient Hospital Stay (HOSPITAL_COMMUNITY)
Admission: AD | Admit: 2018-12-01 | Discharge: 2018-12-01 | Disposition: A | Discharge status: Home / Self Care | Payer: 59 | Attending: Obstetrics and Gynecology | Admitting: Obstetrics and Gynecology

## 2018-12-01 DIAGNOSIS — Z3A25 25 weeks gestation of pregnancy: Secondary | ICD-10-CM

## 2018-12-01 DIAGNOSIS — J069 Acute upper respiratory infection, unspecified: Secondary | ICD-10-CM | POA: Insufficient documentation

## 2018-12-01 DIAGNOSIS — O99512 Diseases of the respiratory system complicating pregnancy, second trimester: Secondary | ICD-10-CM | POA: Diagnosis not present

## 2018-12-01 DIAGNOSIS — O26892 Other specified pregnancy related conditions, second trimester: Secondary | ICD-10-CM

## 2018-12-01 DIAGNOSIS — R0602 Shortness of breath: Secondary | ICD-10-CM | POA: Diagnosis present

## 2018-12-01 HISTORY — DX: Unspecified abnormal cytological findings in specimens from vagina: R87.629

## 2018-12-01 HISTORY — DX: Headache: R51

## 2018-12-01 HISTORY — DX: Headache, unspecified: R51.9

## 2018-12-01 LAB — URINALYSIS, ROUTINE W REFLEX MICROSCOPIC
Bilirubin Urine: NEGATIVE
Glucose, UA: 50 mg/dL — AB
Hgb urine dipstick: NEGATIVE
Ketones, ur: NEGATIVE mg/dL
Leukocytes, UA: NEGATIVE
Nitrite: NEGATIVE
Protein, ur: NEGATIVE mg/dL
Specific Gravity, Urine: 1.005 (ref 1.005–1.030)
pH: 6 (ref 5.0–8.0)

## 2018-12-01 MED ORDER — ALBUTEROL SULFATE (2.5 MG/3ML) 0.083% IN NEBU
2.5000 mg | INHALATION_SOLUTION | Freq: Once | RESPIRATORY_TRACT | Status: AC
  Administered 2018-12-01: 2.5 mg via RESPIRATORY_TRACT
  Filled 2018-12-01: qty 3

## 2018-12-01 MED ORDER — ALBUTEROL SULFATE HFA 108 (90 BASE) MCG/ACT IN AERS: 1.0000 | INHALATION_SPRAY | Freq: Four times a day (QID) | RESPIRATORY_TRACT | 0 refills | Status: DC | PRN

## 2018-12-01 NOTE — MAU Note (Signed)
Urine in lab 

## 2018-12-01 NOTE — MAU Note (Signed)
Pt has a cough since Thursday, feels SOB today. Had a fever every day, today 100 and took tylenol and brought it down. Taking several medications for the cold. No flu reported in the family.

## 2018-12-01 NOTE — Discharge Instructions (Signed)
Upper Respiratory Infection, Adult An upper respiratory infection (URI) is a common viral infection of the nose, throat, and upper air passages that lead to the lungs. The most common type of URI is the common cold. URIs usually get better on their own, without medical treatment. What are the causes? A URI is caused by a virus. You may catch a virus by:  Breathing in droplets from an infected person's cough or sneeze.  Touching something that has been exposed to the virus (contaminated) and then touching your mouth, nose, or eyes. What increases the risk? You are more likely to get a URI if:  You are very young or very old.  It is autumn or winter.  You have close contact with others, such as at a daycare, school, or health care facility.  You smoke.  You have long-term (chronic) heart or lung disease.  You have a weakened disease-fighting (immune) system.  You have nasal allergies or asthma.  You are experiencing a lot of stress.  You work in an area that has poor air circulation.  You have poor nutrition. What are the signs or symptoms? A URI usually involves some of the following symptoms:  Runny or stuffy (congested) nose.  Sneezing.  Cough.  Sore throat.  Headache.  Fatigue.  Fever.  Loss of appetite.  Pain in your forehead, behind your eyes, and over your cheekbones (sinus pain).  Muscle aches.  Redness or irritation of the eyes.  Pressure in the ears or face. How is this diagnosed? This condition may be diagnosed based on your medical history and symptoms, and a physical exam. Your health care provider may use a cotton swab to take a mucus sample from your nose (nasal swab). This sample can be tested to determine what virus is causing the illness. How is this treated? URIs usually get better on their own within 7-10 days. You can take steps at home to relieve your symptoms. Medicines cannot cure URIs, but your health care provider may recommend  certain medicines to help relieve symptoms, such as:  Over-the-counter cold medicines.  Cough suppressants. Coughing is a type of defense against infection that helps to clear the respiratory system, so take these medicines only as recommended by your health care provider.  Fever-reducing medicines. Follow these instructions at home: Activity  Rest as needed.  If you have a fever, stay home from work or school until your fever is gone or until your health care provider says you are no longer contagious. Your health care provider may have you wear a face mask to prevent your infection from spreading. Relieving symptoms  Gargle with a salt-water mixture 3-4 times a day or as needed. To make a salt-water mixture, completely dissolve -1 tsp of salt in 1 cup of warm water.  Use a cool-mist humidifier to add moisture to the air. This can help you breathe more easily. Eating and drinking   Drink enough fluid to keep your urine pale yellow.  Eat soups and other clear broths. General instructions   Take over-the-counter and prescription medicines only as told by your health care provider. These include cold medicines, fever reducers, and cough suppressants.  Do not use any products that contain nicotine or tobacco, such as cigarettes and e-cigarettes. If you need help quitting, ask your health care provider.  Stay away from secondhand smoke.  Stay up to date on all immunizations, including the yearly (annual) flu vaccine.  Keep all follow-up visits as told by your health   care provider. This is important. How to prevent the spread of infection to others   URIs can be passed from person to person (are contagious). To prevent the infection from spreading: ? Wash your hands often with soap and water. If soap and water are not available, use hand sanitizer. ? Avoid touching your mouth, face, eyes, or nose. ? Cough or sneeze into a tissue or your sleeve or elbow instead of into your hand  or into the air. Contact a health care provider if:  You are getting worse instead of better.  You have a fever or chills.  Your mucus is brown or red.  You have yellow or brown discharge coming from your nose.  You have pain in your face, especially when you bend forward.  You have swollen neck glands.  You have pain while swallowing.  You have white areas in the back of your throat. Get help right away if:  You have shortness of breath that gets worse.  You have severe or persistent: ? Headache. ? Ear pain. ? Sinus pain. ? Chest pain.  You have chronic lung disease along with any of the following: ? Wheezing. ? Prolonged cough. ? Coughing up blood. ? A change in your usual mucus.  You have a stiff neck.  You have changes in your: ? Vision. ? Hearing. ? Thinking. ? Mood. Summary  An upper respiratory infection (URI) is a common infection of the nose, throat, and upper air passages that lead to the lungs.  A URI is caused by a virus.  URIs usually get better on their own within 7-10 days.  Medicines cannot cure URIs, but your health care provider may recommend certain medicines to help relieve symptoms. This information is not intended to replace advice given to you by your health care provider. Make sure you discuss any questions you have with your health care provider. Document Released: 05/01/2001 Document Revised: 06/21/2017 Document Reviewed: 06/21/2017 Elsevier Interactive Patient Education  2019 Elsevier Inc.    

## 2018-12-01 NOTE — MAU Provider Note (Signed)
History     CSN: [id number]  Arrival date and time: 12/01/18 1946   First Provider Initiated Contact with Patient 12/01/18 2058      Chief Complaint  Patient presents with  . Cough  . Shortness of Breath   Kara Cherry is a 33 y.o. G1P0 at [redacted]w[redacted]d who presents for Cough and Shortness of Breath.  Patient states that her symptoms started about 4 days ago with body aches, fever, and sore throat.  She states the cough came about 2 days later but has been progressively worsening.  She denies production with cough, but states it "feels like something is trying to break up when I cough."  Patient reports having a fever today and took tylenol with relief.  Patient reports taking Robitussin and Sudafed for her symptoms with no relief.  Patient reports that she has been off of work since her symptoms started and endorses receiving a flu shot.  She endorses good fetal movement and denies LoF, VB, and cramping.  She reports feelings of stomach upset similar to that of diarrhea, but states she has not had diarrhea yet.  Reports taking tylenol and sudafed at 1700 and sudafed yesterday as it is a 24 hour regime.      OB History    Gravida  1   Para      Term      Preterm      AB      Living        SAB      TAB      Ectopic      Multiple      Live Births              Past Medical History:  Diagnosis Date  . Headache   . Hypertension   . Vaginal Pap smear, abnormal     Past Surgical History:  Procedure Laterality Date  . WISDOM TOOTH EXTRACTION      Family History  Problem Relation Age of Onset  . Hypertension Mother   . Hypertension Father   . Heart disease Paternal Grandmother   . Stroke Paternal Grandmother   . Diabetes Paternal Grandfather   . Heart disease Paternal Grandfather     Social History   Tobacco Use  . Smoking status: Never Smoker  . Smokeless tobacco: Never Used  Substance Use Topics  . Alcohol use: Not Currently    Comment: not while preg   . Drug use: No    Allergies:  Allergies  Allergen Reactions  . Sulfa Antibiotics     Medications Prior to Admission  Medication Sig Dispense Refill Last Dose  . acetaminophen (TYLENOL) 650 MG CR tablet Take 650 mg by mouth every 8 (eight) hours as needed for pain.   12/01/2018 at 1700  . guaiFENesin (ROBITUSSIN) 100 MG/5ML liquid Take 200 mg by mouth 3 (three) times daily as needed for cough.   12/01/2018 at Unknown time  . Prenatal Vit-Fe Fumarate-FA (MULTIVITAMIN-PRENATAL) 27-0.8 MG TABS tablet Take 1 tablet by mouth daily at 12 noon.   12/01/2018 at Unknown time    Review of Systems  Constitutional: Positive for fever. Negative for chills.  HENT: Positive for congestion, rhinorrhea and sinus pressure. Negative for postnasal drip and sore throat. Sneezing: Minimal.   Respiratory: Positive for cough and shortness of breath (Started yesterday).   Cardiovascular: Positive for chest pain. Leg swelling: With cough.  Gastrointestinal: Negative for constipation, diarrhea, nausea and vomiting.  Genitourinary: Negative for dysuria, vaginal  bleeding and vaginal discharge.  Musculoskeletal: Negative for myalgias.  Neurological: Positive for dizziness (Occurs when febrile) and light-headedness.   Physical Exam   Blood pressure 139/84, pulse (!) 114, temperature 98.8 F (37.1 C), temperature source Oral, resp. rate 20, weight 79.4 kg, last menstrual period 06/14/2018, SpO2 99 %.  Physical Exam  Constitutional: She is oriented to person, place, and time. She appears well-developed and well-nourished. No distress.  HENT:  Head: Normocephalic and atraumatic.  Eyes: Conjunctivae are normal.  Neck: Normal range of motion.  Cardiovascular: Normal rate, regular rhythm and normal heart sounds.  Respiratory: Effort normal. No respiratory distress. She has no decreased breath sounds. She has wheezes (Clears with deep cough).  GI: Soft.  Musculoskeletal: Normal range of motion.        General: No  edema.  Neurological: She is alert and oriented to person, place, and time.  Skin: Skin is warm and dry.  Psychiatric: She has a normal mood and affect. Her behavior is normal.    MAU Course  Procedures Results for orders placed or performed during the hospital encounter of 12/01/18 (from the past 24 hour(s))  Urinalysis, Routine w reflex microscopic     Status: Abnormal   Collection Time: 12/01/18  8:41 PM  Result Value Ref Range   Color, Urine STRAW (A) YELLOW   APPearance CLEAR CLEAR   Specific Gravity, Urine 1.005 1.005 - 1.030   pH 6.0 5.0 - 8.0   Glucose, UA 50 (A) NEGATIVE mg/dL   Hgb urine dipstick NEGATIVE NEGATIVE   Bilirubin Urine NEGATIVE NEGATIVE   Ketones, ur NEGATIVE NEGATIVE mg/dL   Protein, ur NEGATIVE NEGATIVE mg/dL   Nitrite NEGATIVE NEGATIVE   Leukocytes, UA NEGATIVE NEGATIVE    MDM Physical Exam EFM Labs: UA Nebulizer Treatment  Assessment and Plan  IUP at 25.3wks Reactive NST SOB Viral Respiratory Infection  -Exam findings discussed -Discussed giving albuterol treatment and reassessing after -Will also assess UA for need for IV hydration   Follow Up (10:12 PM) Upper Respiratory Infection  -Patient reports improvement with albuterol treatment -Rx for Albuterol Inhaler 37mcg/ACT-Take 1-2 puffs q6hrs prn -Discussed proper usage of inhaler -Discussed treatment of symptoms with pregnancy safe medications; *Pharmacological: Robitussin, Tylenol, Sudafed, Etc *Non-pharmacological; Humidifier, Rest, Hydration. -Instructed to start Claritin or Zyrtec until symptoms resolve and then prn -Encouraged to call or return to MAU if symptoms worsen or with the onset of new symptoms. -Discharged to home in stable condition   Cherre Robins MSN, CNM 12/01/2018, 8:58 PM

## 2019-01-06 ENCOUNTER — Telehealth: Payer: Self-pay | Admitting: Family Medicine

## 2019-01-06 NOTE — Telephone Encounter (Signed)
Copied from CRM [id number]. Topic: General - Other >> Jan 06, 2019 10:21 AM Percival Spanish wrote:    Pt call to ask if there has been a PA on the below med       NOT SHOWING THIS MED LIST    CLINDAMYCIN-BENZOYL PEROXIDE

## 2019-01-06 NOTE — Telephone Encounter (Signed)
Tried to call pt about request no answer, will try again at later date.

## 2019-01-07 NOTE — Telephone Encounter (Signed)
Pt was not given this medication from our practice.

## 2019-01-07 NOTE — Telephone Encounter (Signed)
Spoke with pt and advised her to make an appointment to follow-up with Dr. Eldred Manges for refill of medication. Pt verbalized understanding and states she will request a new Rx at next office visit.

## 2019-02-11 LAB — OB RESULTS CONSOLE GBS: GBS: NEGATIVE

## 2019-02-27 ENCOUNTER — Encounter (HOSPITAL_COMMUNITY): Payer: Self-pay | Admitting: *Deleted

## 2019-02-27 ENCOUNTER — Other Ambulatory Visit: Payer: Self-pay

## 2019-02-27 ENCOUNTER — Inpatient Hospital Stay (HOSPITAL_COMMUNITY)
Admission: AD | Admit: 2019-02-27 | Discharge: 2019-03-04 | DRG: 788 | Disposition: A | Discharge status: Home / Self Care | Payer: 59 | Attending: Obstetrics and Gynecology | Admitting: Obstetrics and Gynecology

## 2019-02-27 DIAGNOSIS — Z3A38 38 weeks gestation of pregnancy: Secondary | ICD-10-CM | POA: Diagnosis not present

## 2019-02-27 DIAGNOSIS — O139 Gestational [pregnancy-induced] hypertension without significant proteinuria, unspecified trimester: Secondary | ICD-10-CM | POA: Diagnosis present

## 2019-02-27 DIAGNOSIS — Z98891 History of uterine scar from previous surgery: Secondary | ICD-10-CM

## 2019-02-27 DIAGNOSIS — O368390 Maternal care for abnormalities of the fetal heart rate or rhythm, unspecified trimester, not applicable or unspecified: Secondary | ICD-10-CM

## 2019-02-27 DIAGNOSIS — R030 Elevated blood-pressure reading, without diagnosis of hypertension: Secondary | ICD-10-CM | POA: Diagnosis present

## 2019-02-27 DIAGNOSIS — Z3689 Encounter for other specified antenatal screening: Secondary | ICD-10-CM | POA: Diagnosis not present

## 2019-02-27 DIAGNOSIS — O134 Gestational [pregnancy-induced] hypertension without significant proteinuria, complicating childbirth: Secondary | ICD-10-CM | POA: Diagnosis not present

## 2019-02-27 DIAGNOSIS — O368330 Maternal care for abnormalities of the fetal heart rate or rhythm, third trimester, not applicable or unspecified: Secondary | ICD-10-CM

## 2019-02-27 DIAGNOSIS — O133 Gestational [pregnancy-induced] hypertension without significant proteinuria, third trimester: Secondary | ICD-10-CM | POA: Diagnosis not present

## 2019-02-27 LAB — COMPREHENSIVE METABOLIC PANEL
ALT: 17 U/L (ref 0–44)
AST: 17 U/L (ref 15–41)
Albumin: 2.6 g/dL — ABNORMAL LOW (ref 3.5–5.0)
Alkaline Phosphatase: 117 U/L (ref 38–126)
Anion gap: 9 (ref 5–15)
BUN: 7 mg/dL (ref 6–20)
CO2: 20 mmol/L — ABNORMAL LOW (ref 22–32)
Calcium: 9.1 mg/dL (ref 8.9–10.3)
Chloride: 105 mmol/L (ref 98–111)
Creatinine, Ser: 0.75 mg/dL (ref 0.44–1.00)
GFR calc Af Amer: >60 mL/min (ref >60)
GFR calc non Af Amer: >60 mL/min (ref >60)
Glucose, Bld: 124 mg/dL — ABNORMAL HIGH (ref 70–99)
Potassium: 3.5 mmol/L (ref 3.5–5.1)
Sodium: 134 mmol/L — ABNORMAL LOW (ref 135–145)
Total Bilirubin: 0.4 mg/dL (ref 0.3–1.2)
Total Protein: 5.8 g/dL — ABNORMAL LOW (ref 6.5–8.1)

## 2019-02-27 LAB — CBC
HCT: 33.6 % — ABNORMAL LOW (ref 36.0–46.0)
Hemoglobin: 11.4 g/dL — ABNORMAL LOW (ref 12.0–15.0)
MCH: 27 pg (ref 26.0–34.0)
MCHC: 33.9 g/dL (ref 30.0–36.0)
MCV: 79.6 fL — ABNORMAL LOW (ref 80.0–100.0)
Platelets: 229 10*3/uL (ref 150–400)
RBC: 4.22 MIL/uL (ref 3.87–5.11)
RDW: 14.8 % (ref 11.5–15.5)
WBC: 10.5 10*3/uL (ref 4.0–10.5)
nRBC: 0 % (ref 0.0–0.2)

## 2019-02-27 LAB — URINALYSIS, ROUTINE W REFLEX MICROSCOPIC
Bilirubin Urine: NEGATIVE
Glucose, UA: NEGATIVE mg/dL
Hgb urine dipstick: NEGATIVE
Ketones, ur: NEGATIVE mg/dL
Leukocytes,Ua: NEGATIVE
Nitrite: NEGATIVE
Protein, ur: NEGATIVE mg/dL
Specific Gravity, Urine: 1.003 — ABNORMAL LOW (ref 1.005–1.030)
pH: 7 (ref 5.0–8.0)

## 2019-02-27 LAB — PROTEIN / CREATININE RATIO, URINE
Creatinine, Urine: 22.09 mg/dL
Total Protein, Urine: <6 mg/dL

## 2019-02-27 LAB — OB RESULTS CONSOLE ABO/RH
RH Type: POSITIVE
RH Type: POSITIVE
RH Type: POSITIVE

## 2019-02-27 LAB — TYPE AND SCREEN
ABO/RH(D): B POS
Antibody Screen: NEGATIVE

## 2019-02-27 LAB — ABO/RH: ABO/RH(D): B POS

## 2019-02-27 MED ORDER — OXYCODONE-ACETAMINOPHEN 5-325 MG PO TABS: 2.0000 | ORAL_TABLET | ORAL | Status: DC | PRN

## 2019-02-27 MED ORDER — LACTATED RINGERS IV SOLN
INTRAVENOUS | Status: DC
  Administered 2019-02-27 – 2019-03-02 (×8): via INTRAVENOUS

## 2019-02-27 MED ORDER — LIDOCAINE HCL (PF) 1 % IJ SOLN
30.0000 mL | INTRAMUSCULAR | Status: DC | PRN
  Filled 2019-02-27: qty 30

## 2019-02-27 MED ORDER — MISOPROSTOL 100 MCG PO TABS
25.0000 ug | ORAL_TABLET | ORAL | Status: DC | PRN
  Administered 2019-02-27 – 2019-02-28 (×3): 25 ug via VAGINAL
  Filled 2019-02-27 (×4): qty 1

## 2019-02-27 MED ORDER — OXYCODONE-ACETAMINOPHEN 5-325 MG PO TABS: 1.0000 | ORAL_TABLET | ORAL | Status: DC | PRN

## 2019-02-27 MED ORDER — OXYTOCIN BOLUS FROM INFUSION: 500.0000 mL | Freq: Once | INTRAVENOUS | Status: DC

## 2019-02-27 MED ORDER — OXYTOCIN 40 UNITS IN NORMAL SALINE INFUSION - SIMPLE MED: 2.5000 [IU]/h | INTRAVENOUS | Status: DC

## 2019-02-27 MED ORDER — ONDANSETRON HCL 4 MG/2ML IJ SOLN
4.0000 mg | Freq: Four times a day (QID) | INTRAMUSCULAR | Status: DC | PRN
  Administered 2019-03-01 (×2): 4 mg via INTRAVENOUS
  Filled 2019-02-27 (×3): qty 2

## 2019-02-27 MED ORDER — ACETAMINOPHEN 325 MG PO TABS: 650.0000 mg | ORAL_TABLET | ORAL | Status: DC | PRN

## 2019-02-27 MED ORDER — LACTATED RINGERS IV SOLN
500.0000 mL | INTRAVENOUS | Status: DC | PRN
  Administered 2019-03-01: 500 mL via INTRAVENOUS

## 2019-02-27 MED ORDER — SOD CITRATE-CITRIC ACID 500-334 MG/5ML PO SOLN
30.0000 mL | ORAL | Status: DC | PRN
  Administered 2019-03-01: 30 mL via ORAL
  Filled 2019-02-27: qty 15
  Filled 2019-02-27: qty 30

## 2019-02-27 MED ORDER — TERBUTALINE SULFATE 1 MG/ML IJ SOLN
0.2500 mg | Freq: Once | INTRAMUSCULAR | Status: DC | PRN
  Filled 2019-02-27: qty 1

## 2019-02-27 NOTE — H&P (Signed)
Kara Cherry is a 33 y.o. female, G1P0 at 59 weeks, presenting for increased BP at home.  Patient complains of dizziness.  Denies headache, or epigastric pain.Marland Kitchen Pt obtained prenatal care from [place].   History of present pregnancy: Patient entered care at 10.2 weeks.   EDC of  03/13/19 was established by LMP.   Anatomy scan: 19.6 weeks, with normal findings and an Additional [place] evaluations:   Significant prenatal events:  Last evaluation: This week.  OB History    Gravida  1   Para      Term      Preterm      AB      Living        SAB      TAB      Ectopic      Multiple      Live Births             Past Medical History:  Diagnosis Date  . Headache   . Hypertension   . Vaginal Pap smear, abnormal    Past Surgical History:  Procedure Laterality Date  . WISDOM TOOTH EXTRACTION     Family History: family history includes Diabetes in her paternal grandfather; Heart disease in her paternal grandfather and paternal grandmother; Hypertension in her father and mother; Stroke in her paternal grandmother. Social History:  reports that she has never smoked. She has never used smokeless tobacco. She reports previous alcohol use. She reports that she does not use drugs.   Prenatal Transfer Tool  Maternal Diabetes: No Genetic Screening: Normal Maternal Ultrasounds/Referrals: Normal Fetal Ultrasounds or other Referrals:  None Maternal Substance Abuse:  No Significant Maternal Medications:  None Significant Maternal Lab Results: None   ROS: All ten systems reviewed and negative except as stated above  Allergies  Allergen Reactions  . Sulfa Antibiotics      Dilation: Closed Effacement (%): 70 Exam by:: N,Nugent,NP Blood pressure 137/78, pulse (!) 101, temperature 99.5 F (37.5 C), temperature source Oral, resp. rate 17, height 5\' 7"  (1.702 m), weight 89.6 kg, last menstrual period 06/14/2018, SpO2 100 %.  Chest clear Heart RRR without murmur Abd gravid,  NT, FH appropriate Pelvic: Per MAU Ext: no edema  FHR: Category 1 FHT 140 accels, variability present occasional variable  UCs: none   Prenatal labs: ABO, Rh:  B pos Antibody:  Neg Rubella:   Imm RPR:   NR HBsAg:   NEG HIV:   NR GBS:  Neg Sickle cell/Hgb electrophoresis:  Pap:  2018 neg GC: Neg Chlamydia: Neg Genetic screenings: Neg Glucola: 108 Other:   Hgb 14.4 at NOB, 11.8 at 28 weeks       Assessment/Plan: IUP at 38 gestational hypertension Cat 1 strip  Plan: Admit to Birthing Suite  Routine [facility] orders Pain med/epidural prn   Henderson Newcomer ProtheroCNM, MSN 02/27/2019, 9:12 PM

## 2019-02-27 NOTE — MAU Note (Signed)
Her dr has been watching her for pre eclampsia.  Was told to get a bp cuff.  Was ok this morning.  Went out shopping.  Got a little dizzy. Came home, went upstairs, was 180/90,  Rechecked it 144/92.  Stayed that way about an hour, then came down to 127/82.  Denies HA, no visual changes, epigastric pain or increase in swelling.  No longer feeling dizzy.  Called MD, was told to come in.

## 2019-02-27 NOTE — MAU Provider Note (Addendum)
History     CSN: [id number]  Arrival date and time: 02/27/19 1607   First Provider Initiated Contact with Patient 02/27/19 1739      Chief Complaint  Patient presents with  . Hypertension   Ms. Kara Cherry is a 33 y.o. G1P0 at [redacted]w[redacted]d who presents to MAU for preeclampsia evaluation after she felt dizzy today while at the grocery store around 1100. Pt reports she went home and took her BP immediately after climbing three flights of stairs which was 180/90. Pt reports she retook her BP 5 min later and it was 140s/90s for about . About an hour after the incident it dropped to 120s/80s. Pt called her MD and was told to come to MAU for evaluation for preeclampsia.  Pt reports irregular ctx.  Pt denies HA, blurry vision/seeing spots, N/V, epigastric pain, swelling in face and hands, sudden weight gain. Pt denies chest pain and SOB.  Pt denies constipation, diarrhea, or urinary problems. Pt denies fever, chills, fatigue, sweating or changes in appetite. Pt denies light-headedness, weakness.  Pt denies VB, LOF and reports good FM.  Current pregnancy problems? gHTN Blood Type? unknown Allergies? sulfa Current medications? PNVs, claritin, clindamycin/BP acne cream, Emergen-C Current PNC & next appt? [facility], Monday 03/02/2019  OB History    Gravida  1   Para      Term      Preterm      AB      Living        SAB      TAB      Ectopic      Multiple      Live Births              Past Medical History:  Diagnosis Date  . Headache   . Hypertension   . Vaginal Pap smear, abnormal     Past Surgical History:  Procedure Laterality Date  . WISDOM TOOTH EXTRACTION      Family History  Problem Relation Age of Onset  . Hypertension Mother   . Hypertension Father   . Heart disease Paternal Grandmother   . Stroke Paternal Grandmother   . Diabetes Paternal Grandfather   . Heart disease Paternal Grandfather     Social History   Tobacco Use  .  Smoking status: Never Smoker  . Smokeless tobacco: Never Used  Substance Use Topics  . Alcohol use: Not Currently    Comment: not while preg  . Drug use: No    Allergies:  Allergies  Allergen Reactions  . Sulfa Antibiotics     Medications Prior to Admission  Medication Sig Dispense Refill Last Dose  . acetaminophen (TYLENOL) 650 MG CR tablet Take 650 mg by mouth every 8 (eight) hours as needed for pain.   Past Month at Unknown time  . clindamycin-benzoyl peroxide (BENZACLIN) gel Apply topically 2 (two) times daily.     Marland Kitchen loratadine (CLARITIN) 10 MG tablet Take 10 mg by mouth daily.     . Prenatal Vit-Fe Fumarate-FA (MULTIVITAMIN-PRENATAL) 27-0.8 MG TABS tablet Take 1 tablet by mouth daily at 12 noon.   02/27/2019 at Unknown time  . albuterol (PROVENTIL HFA;VENTOLIN HFA) 108 (90 Base) MCG/ACT inhaler Inhale 1-2 puffs into the lungs every 6 (six) hours as needed for wheezing or shortness of breath. 1 Inhaler 0 More than a month at Unknown time  . guaiFENesin (ROBITUSSIN) 100 MG/5ML liquid Take 200 mg by mouth 3 (three) times daily as needed for cough.  More than a month at Unknown time    Review of Systems  Constitutional: Negative for appetite change, chills, diaphoresis, fatigue and fever.  Respiratory: Negative for shortness of breath.   Cardiovascular: Negative for chest pain.  Gastrointestinal: Negative for abdominal pain, constipation, diarrhea, nausea and vomiting.  Genitourinary: Negative for dysuria, flank pain, frequency, pelvic pain, urgency, vaginal bleeding and vaginal discharge.  Neurological: Positive for dizziness. Negative for weakness, light-headedness and headaches.   Physical Exam   Blood pressure 137/78, pulse (!) 101, temperature 99.5 F (37.5 C), temperature source Oral, resp. rate 17, height  (1.702 m), weight 89.6 kg, last menstrual period 06/14/2018, SpO2 100 %.  Patient Vitals for the past 24 hrs:  BP Temp Temp src Pulse Resp SpO2 Height Weight   02/27/19 2100 137/78 - - (!) 101 - - - -  02/27/19 2045 (!) 136/91 - - 93 - - - -  02/27/19 2030 130/90 - - 80 - - - -  02/27/19 2015 128/87 - - 90 - - - -  02/27/19 2000 121/83 - - 92 - - - -  02/27/19 1945 129/81 - - 93 - - - -  02/27/19 1930 (!) 143/91 - - (!) 103 - - - -  02/27/19 1928 (!) 146/84 - - (!) 102 - - - -  02/27/19 1915 127/86 - - 97 - - - -  02/27/19 1900 122/83 - - 99 - - - -  02/27/19 1845 126/82 - - (!) 105 - - - -  02/27/19 1830 124/83 - - 97 - - - -  02/27/19 1815 129/81 - - 96 - - - -  02/27/19 1800 127/85 - - 94 - - - -  02/27/19 1745 125/81 - - (!) 102 - - - -  02/27/19 1730 127/86 - - 97 - - - -  02/27/19 1715 127/85 - - (!) 101 - - - -  02/27/19 1706 136/90 - - (!) 102 - - - -  02/27/19 1647 137/89 99.5 F (37.5 C) Oral 100 17 100 %  (1.702 m) 89.6 kg    Physical Exam  Constitutional: She is oriented to person, place, and time. She appears well-developed and well-nourished. No distress.  HENT:  Head: Normocephalic and atraumatic.  Respiratory: Effort normal.  GI: Soft. She exhibits no distension and no mass. There is no abdominal tenderness. There is no rebound and no guarding.  Genitourinary: There is no rash, tenderness or lesion on the right labia. There is no rash, tenderness or lesion on the left labia.    Genitourinary Comments: CE: posterior/closed/80   Neurological: She is alert and oriented to person, place, and time.  Skin: Skin is warm and dry. She is not diaphoretic.  Psychiatric: She has a normal mood and affect. Her behavior is normal.   Results for orders placed or performed during the hospital encounter of 02/27/19 (from the past 24 hour(s))  Protein / creatinine ratio, urine     Status: None   Collection Time: 02/27/19  5:48 PM  Result Value Ref Range   Creatinine, Urine 22.09 mg/dL   Total Protein, Urine <6 mg/dL   Protein Creatinine Ratio        0.00 - 0.15 mg/mg[Cre]  CBC     Status: Abnormal   Collection Time: 02/27/19   6:09 PM  Result Value Ref Range   WBC 10.5 4.0 - 10.5 K/uL   RBC 4.22 3.87 - 5.11 MIL/uL   Hemoglobin 11.4 (L) 12.0 -  15.0 g/dL   HCT 16.1 (L) 09.6 - 04.5 %   MCV 79.6 (L) 80.0 - 100.0 fL   MCH 27.0 26.0 - 34.0 pg   MCHC 33.9 30.0 - 36.0 g/dL   RDW 40.9 81.1 - 91.4 %   Platelets 229 150 - 400 K/uL   nRBC 0.0 0.0 - 0.2 %  Comprehensive metabolic panel     Status: Abnormal   Collection Time: 02/27/19  6:09 PM  Result Value Ref Range   Sodium 134 (L) 135 - 145 mmol/L   Potassium 3.5 3.5 - 5.1 mmol/L   Chloride 105 98 - 111 mmol/L   CO2 20 (L) 22 - 32 mmol/L   Glucose, Bld 124 (H) 70 - 99 mg/dL   BUN 7 6 - 20 mg/dL   Creatinine, Ser 7.82 0.44 - 1.00 mg/dL   Calcium 9.1 8.9 - 95.6 mg/dL   Total Protein 5.8 (L) 6.5 - 8.1 g/dL   Albumin 2.6 (L) 3.5 - 5.0 g/dL   AST 17 15 - 41 U/L   ALT 17 0 - 44 U/L   Alkaline Phosphatase 117 38 - 126 U/L   Total Bilirubin 0.4 0.3 - 1.2 mg/dL   GFR calc non Af Amer >60 >60 mL/min   GFR calc Af Amer >60 >60 mL/min   Anion gap 9 5 - 15  Urinalysis, Routine w reflex microscopic     Status: Abnormal   Collection Time: 02/27/19  6:12 PM  Result Value Ref Range   Color, Urine STRAW (A) YELLOW   APPearance CLEAR CLEAR   Specific Gravity, Urine 1.003 (L) 1.005 - 1.030   pH 7.0 5.0 - 8.0   Glucose, UA NEGATIVE NEGATIVE mg/dL   Hgb urine dipstick NEGATIVE NEGATIVE   Bilirubin Urine NEGATIVE NEGATIVE   Ketones, ur NEGATIVE NEGATIVE mg/dL   Protein, ur NEGATIVE NEGATIVE mg/dL   Nitrite NEGATIVE NEGATIVE   Leukocytes,Ua NEGATIVE NEGATIVE    No results found.  MAU Course  Procedures  MDM -preeclampsia evaluation -UA: straw/SG 1.003, all else WNL -CBC: WNL for pregnancy, platelets 229 -CMP: glucose 124 (pt had eaten crackers shortly before blood draw), no abnormalities requiring treatment -PRO/Creat: result below reportable range, unable to  calculate -EFM: reactive with variable decels       -baseline: 150/140       -variability: moderate        -accels: present (15x15)       -decels: multiple variable       -TOCO: irritability -of note, pt has a low-grade fever of 99.5 -called Dr. Vergie Living at approx 2000 and relayed above information, per Dr. Vergie Living, would recommend admission and induction based on NST/gHTN/GA; no further work-up of fever necessary; if discharged home, would recommend BPP prior to discharge with BP check in MAU tomorrow; per Dr. Vergie Living, OK to call private for further instructions -called placed to on-call midwife, Kathalene Frames , left VM requesting return call -call placed to Kathalene Frames , left VM requesting return call -call placed to Kathalene Frames @ 2100, no VM left, called Dr. Su Hilt, on-call physician -per Dr. Su Hilt, admit for induction, care to be transferred [facility] -called and spoke with Harriett Sine to relay above information about pt, care transferred to [facility] .  Orders Placed This Encounter  Procedures  . Protein / creatinine ratio, urine    Standing Status:   Standing    Number of Occurrences:   1  . CBC    Standing Status:  Standing    Number of Occurrences:   1  . Comprehensive metabolic panel    Standing Status:   Standing    Number of Occurrences:   1  . Urinalysis, Routine w reflex microscopic    Standing Status:   Standing    Number of Occurrences:   1   No orders of the defined types were placed in this encounter.  Assessment and Plan   1. [redacted] weeks gestation of pregnancy   2. NST (non-stress test) reactive   3. Variable fetal heart rate decelerations, antepartum   4. Gestational hypertension, third trimester    -admit to L&D for induction d/t gHTN and variable decelerations on reactive tracing -care transferred to Bernerd PhoNancy Prothero, CNM, Harriett SineNancy to enter admission orders  Odie Seraicole E Myha Arizpe 02/27/2019, 9:31 PM

## 2019-02-28 ENCOUNTER — Inpatient Hospital Stay (HOSPITAL_COMMUNITY): Payer: 59 | Admitting: Anesthesiology

## 2019-02-28 LAB — OB RESULTS CONSOLE ABO/RH: RH Type: NEGATIVE

## 2019-02-28 LAB — CBC
HCT: 35.5 % — ABNORMAL LOW (ref 36.0–46.0)
Hemoglobin: 11.7 g/dL — ABNORMAL LOW (ref 12.0–15.0)
MCH: 26.1 pg (ref 26.0–34.0)
MCHC: 33 g/dL (ref 30.0–36.0)
MCV: 79.2 fL — ABNORMAL LOW (ref 80.0–100.0)
Platelets: 233 10*3/uL (ref 150–400)
RBC: 4.48 MIL/uL (ref 3.87–5.11)
RDW: 14.8 % (ref 11.5–15.5)
WBC: 14.6 10*3/uL — ABNORMAL HIGH (ref 4.0–10.5)
nRBC: 0 % (ref 0.0–0.2)

## 2019-02-28 LAB — RPR: RPR Ser Ql: NONREACTIVE

## 2019-02-28 MED ORDER — OXYTOCIN 40 UNITS IN NORMAL SALINE INFUSION - SIMPLE MED
1.0000 m[IU]/min | INTRAVENOUS | Status: DC
  Administered 2019-02-28: 13:00:00 1 m[IU]/min via INTRAVENOUS
  Filled 2019-02-28: qty 1000

## 2019-02-28 MED ORDER — TERBUTALINE SULFATE 1 MG/ML IJ SOLN
0.2500 mg | Freq: Once | INTRAMUSCULAR | Status: DC | PRN
  Filled 2019-02-28: qty 1

## 2019-02-28 MED ORDER — LIDOCAINE-EPINEPHRINE (PF) 2 %-1:200000 IJ SOLN
INTRAMUSCULAR | Status: DC | PRN
  Administered 2019-02-28 – 2019-03-02 (×3): 5 mL via EPIDURAL

## 2019-02-28 MED ORDER — LACTATED RINGERS IV SOLN
500.0000 mL | Freq: Once | INTRAVENOUS | Status: AC
  Administered 2019-02-28: 500 mL via INTRAVENOUS

## 2019-02-28 MED ORDER — EPHEDRINE 5 MG/ML INJ
10.0000 mg | INTRAVENOUS | Status: DC | PRN
  Filled 2019-02-28: qty 2

## 2019-02-28 MED ORDER — PHENYLEPHRINE 40 MCG/ML (10ML) SYRINGE FOR IV PUSH (FOR BLOOD PRESSURE SUPPORT)
80.0000 ug | PREFILLED_SYRINGE | INTRAVENOUS | Status: DC | PRN
  Filled 2019-02-28: qty 10

## 2019-02-28 MED ORDER — DIPHENHYDRAMINE HCL 50 MG/ML IJ SOLN: 12.5000 mg | INTRAMUSCULAR | Status: DC | PRN

## 2019-02-28 MED ORDER — SODIUM CHLORIDE (PF) 0.9 % IJ SOLN
INTRAMUSCULAR | Status: DC | PRN
  Administered 2019-02-28: 14 mL/h via EPIDURAL

## 2019-02-28 MED ORDER — FENTANYL-BUPIVACAINE-NACL 0.5-0.125-0.9 MG/250ML-% EP SOLN
12.0000 mL/h | EPIDURAL | Status: DC | PRN
  Administered 2019-03-02: 12 mL/h via EPIDURAL
  Filled 2019-02-28 (×3): qty 250

## 2019-02-28 MED ORDER — FENTANYL CITRATE (PF) 100 MCG/2ML IJ SOLN
50.0000 ug | INTRAMUSCULAR | Status: DC | PRN
  Administered 2019-02-28: 50 ug via INTRAVENOUS
  Administered 2019-02-28: 100 ug via INTRAVENOUS
  Filled 2019-02-28 (×2): qty 2

## 2019-02-28 NOTE — Anesthesia Preprocedure Evaluation (Signed)
Anesthesia Evaluation  Patient identified by MRN, date of birth, ID band Patient awake    Reviewed: Allergy & Precautions, H&P , NPO status , Patient's Chart, lab work & pertinent test results  History of Anesthesia Complications Negative for: history of anesthetic complications  Airway Mallampati: II  TM Distance: >3 FB Neck ROM: full    Dental no notable dental hx.    Pulmonary neg pulmonary ROS,    Pulmonary exam normal        Cardiovascular hypertension (gestational), Normal cardiovascular exam Rhythm:regular Rate:Normal     Neuro/Psych negative neurological ROS  negative psych ROS   GI/Hepatic negative GI ROS, Neg liver ROS,   Endo/Other  negative endocrine ROS  Renal/GU negative Renal ROS  negative genitourinary   Musculoskeletal   Abdominal   Peds  Hematology negative hematology ROS (+)   Anesthesia Other Findings   Reproductive/Obstetrics (+) Pregnancy                             Anesthesia Physical Anesthesia Plan  ASA: II  Anesthesia Plan: Epidural   Post-op Pain Management:    Induction:   PONV Risk Score and Plan:   Airway Management Planned:   Additional Equipment:   Intra-op Plan:   Post-operative Plan:   Informed Consent: I have reviewed the patients History and Physical, chart, labs and discussed the procedure including the risks, benefits and alternatives for the proposed anesthesia with the patient or authorized representative who has indicated his/her understanding and acceptance.       Plan Discussed with:   Anesthesia Plan Comments:         Anesthesia Quick Evaluation  

## 2019-02-28 NOTE — Progress Notes (Signed)
Kara Cherry is a 33 y.o. G1P0 at [redacted]w[redacted]d by admitted for induction of labor due to Hypertension.  Subjective: Patient reports feeling well. She desires to eat and shower this morning.   Objective: Vitals:   02/28/19 0524 02/28/19 0601 02/28/19 0701 02/28/19 0743  BP:  (!) 101/57 118/83   Pulse:  85 88   Resp:  18 18   Temp: 98.2 F (36.8 C)   98.6 F (37 C)  TempSrc: Oral   Axillary  SpO2:      Weight:      Height:        FHT:  FHR: 120s bpm, variability: moderate,  accelerations:  Present,  decelerations:  Absent UC:   regular, every 4 minutes SVE:   Dilation: Closed Effacement (%): 70 Station: -3 Exam by:: Waynette Buttery, CNM  Labs: Lab Results  Component Value Date   WBC 10.5 02/27/2019   HGB 11.4 (L) 02/27/2019   HCT 33.6 (L) 02/27/2019   MCV 79.6 (L) 02/27/2019   PLT 229 02/27/2019    Assessment / Plan: Induction of labor progressing on cytotec  Labor: Progressing normally Preeclampsia:  no signs or symptoms of toxicity, intake and ouput balanced and labs stable Fetal Wellbeing:  Category I Pain Control:  Labor support without medications I/D:  n/a Anticipated MOD:  NSVD  [name] 02/28/2019, 8:16 AM

## 2019-02-28 NOTE — Progress Notes (Signed)
Kara Cherry is a 33 y.o. G1P0 at [redacted]w[redacted]d by admitted for induction of labor due to Hypertension.  Subjective: Patient reports feeling well. She is resting in bed. Cervix is fingertip now.   Objective: Vitals:   02/28/19 1358 02/28/19 1400 02/28/19 1430 02/28/19 1501  BP:  129/83 134/83 140/82  Pulse:  (!) 109 100 90  Resp: 14  16 16   Temp:      TempSrc:      SpO2:      Weight:      Height:        FHT:  FHR: 120s bpm, variability: moderate,  accelerations:  Present,  decelerations:  Absent UC:   regular, every 4 minutes SVE:   Dilation: 1 Effacement (%): 50 Station: -2 Exam by:: E [employer]: Lab Results  Component Value Date   WBC 10.5 02/27/2019   HGB 11.4 (L) 02/27/2019   HCT 33.6 (L) 02/27/2019   MCV 79.6 (L) 02/27/2019   PLT 229 02/27/2019    Assessment / Plan: Induction of labor due to gestational hypertension,  progressing well on pitocin  Labor: Progressing normally Preeclampsia:  no signs or symptoms of toxicity, intake and ouput balanced and labs stable Fetal Wellbeing:  Category I Pain Control:  Labor support without medications I/D:  n/a Anticipated MOD:  NSVD  [name] 02/28/2019, 3:13 PM

## 2019-02-28 NOTE — Anesthesia Procedure Notes (Signed)
Epidural Patient location during procedure: OB Start time: 02/28/2019 8:00 PM End time: 02/28/2019 8:15 PM  Staffing Anesthesiologist: Lucretia Kern, MD Performed: anesthesiologist   Preanesthetic Checklist Completed: patient identified, pre-op evaluation, timeout performed, IV checked, risks and benefits discussed and monitors and equipment checked  Epidural Patient position: sitting Prep: DuraPrep Patient monitoring: heart rate, continuous pulse ox and blood pressure Approach: right paramedian Location: L3-L4 Injection technique: LOR air  Needle:  Needle type: Tuohy  Needle gauge: 17 G Needle length: 9 cm Needle insertion depth: 7 cm Catheter type: closed end flexible Catheter size: 19 Gauge Catheter at skin depth: 12 cm Test dose: negative and 2% lidocaine with Epi 1:200 K  Assessment Events: blood not aspirated, injection not painful, no injection resistance, negative IV test and no paresthesia  Additional Notes Reason for block:procedure for pain

## 2019-02-28 NOTE — Progress Notes (Signed)
Makensley Spencer is a 33 y.o. G1P0 at [redacted]w[redacted]d by admitted for induction of labor due to Hypertension.  Subjective: Patient is comfortable with IV pain medication. Cooks is still in place and not ready to come out yet.   Objective: Vitals:   02/28/19 1703 02/28/19 1731 02/28/19 1801 02/28/19 1831  BP: 126/84 130/88 138/86 133/80  Pulse: 76 83 89 86  Resp: 16 18    Temp:      TempSrc:      SpO2:      Weight:      Height:        FHT:  FHR: 120s-130s bpm, variability: moderate,  accelerations:  Present,  decelerations:  Absent UC:   irregular, every 2-4 minutes SVE:   Dilation: 1 Effacement (%): 50 Station: -2 Exam by:: E [employer]: Lab Results  Component Value Date   WBC 10.5 02/27/2019   HGB 11.4 (L) 02/27/2019   HCT 33.6 (L) 02/27/2019   MCV 79.6 (L) 02/27/2019   PLT 229 02/27/2019    Assessment / Plan: Induction of labor due to gestational hypertension,  progressing well on pitocin  Labor: Progressing normally Preeclampsia:  no signs or symptoms of toxicity, intake and ouput balanced and labs stable Fetal Wellbeing:  Category I Pain Control:  IV pain meds I/D:  n/a Anticipated MOD:  NSVD  [name] 02/28/2019, 6:47 PM

## 2019-02-28 NOTE — Progress Notes (Signed)
Kara Cherry is a 33 y.o. G1P0 at [redacted]w[redacted]d by admitted for induction of labor due to Hypertension.  Subjective: Discussed inserting catheter through cervix, patient consented. Cooks inserted with stylette and interior balloon filled.    Objective: Vitals:   02/28/19 1358 02/28/19 1400 02/28/19 1430 02/28/19 1501  BP:  129/83 134/83 140/82  Pulse:  (!) 109 100 90  Resp: 14  16 16   Temp:      TempSrc:      SpO2:      Weight:      Height:        FHT:  FHR: 120s bpm, variability: moderate,  accelerations:  Present,  decelerations:  Absent UC:   regular, every 4 minutes SVE:   Dilation: 1 Effacement (%): 50 Station: -2 Exam by:: E [employer]: Lab Results  Component Value Date   WBC 10.5 02/27/2019   HGB 11.4 (L) 02/27/2019   HCT 33.6 (L) 02/27/2019   MCV 79.6 (L) 02/27/2019   PLT 229 02/27/2019    Assessment / Plan: Induction of labor due to gestational hypertension,  progressing well on pitocin  Labor: Progressing normally Preeclampsia:  no signs or symptoms of toxicity, intake and ouput balanced and labs stable Fetal Wellbeing:  Category I Pain Control:  Labor support without medications I/D:  n/a Anticipated MOD:  NSVD  [name] 02/28/2019, 3:17 PM

## 2019-02-28 NOTE — Progress Notes (Signed)
Kara Cherry is a 33 y.o. G1P0 at [redacted]w[redacted]d by admitted for induction of labor due to Hypertension.  Subjective: Patient is comfortable after epidural. Contractions have spaced out and RN continuing to increase pitocin per protocol. Cooks is still in place.   Objective: Vitals:   02/28/19 2100 02/28/19 2130 02/28/19 2200 02/28/19 2230  BP: 113/63 111/60 113/64 111/63  Pulse: 87 82 88 76  Resp: 16 16 16 16   Temp:    98.1 F (36.7 C)  TempSrc:    Oral  SpO2:      Weight:      Height:        FHT:  FHR: 120s bpm, variability: moderate,  accelerations:  Present,  decelerations:  Absent UC:   irregular, every 2-4 minutes SVE:   Dilation: 1 Effacement (%): 50 Station: -2 Exam by:: E [employer]: Lab Results  Component Value Date   WBC 14.6 (H) 02/28/2019   HGB 11.7 (L) 02/28/2019   HCT 35.5 (L) 02/28/2019   MCV 79.2 (L) 02/28/2019   PLT 233 02/28/2019    Assessment / Plan: Induction of labor due to gestational hypertension,  progressing well on pitocin  Labor: Progressing normally Preeclampsia:  no signs or symptoms of toxicity, intake and ouput balanced and labs stable Fetal Wellbeing:  Category I Pain Control:  Epidural I/D:  n/a Anticipated MOD:  NSVD  [name] 02/28/2019, 10:44 PM

## 2019-03-01 MED ORDER — OXYTOCIN 40 UNITS IN NORMAL SALINE INFUSION - SIMPLE MED
1.0000 m[IU]/min | INTRAVENOUS | Status: DC
  Administered 2019-03-01: 6 m[IU]/min via INTRAVENOUS
  Administered 2019-03-01: 09:00:00 8 m[IU]/min via INTRAVENOUS
  Administered 2019-03-02: 07:00:00 40 mL via INTRAVENOUS
  Filled 2019-03-01: qty 1000

## 2019-03-01 NOTE — Progress Notes (Signed)
Chade Monacelli is a 33 y.o. G1P0 at [redacted]w[redacted]d by admitted for induction of labor due to Hypertension.  Subjective: Cooks had been in place for 12 hours, I was able to remove it with light tension. Patient has made cervical change but fetal head not well applied to cevix at present. Discussed possible AROM with next exam.   Several minutes after leaving room I noted a prolonged decel and returned to the room. RN had been repositioning patient when decel occurred. They tried left and right side, and then we tried hands and knees when I entered which improved the tracing. I stopped the pitocin and oxygen and fluid bolus were given. Patient repositioned to right side. I discussed what had occurred with patient and partner, noting at present we would give baby time to recover and continue with induction of labor if the [facility] were reassuring. The need for a possible cesarean section was discussed if FHTs recurrently non-reassuring as patient is remote from delivery.   Objective: Vitals:   03/01/19 0100 03/01/19 0130 03/01/19 0200 03/01/19 0230  BP: 124/76 131/81 130/79 124/76  Pulse: 96 94 (!) 107 88  Resp: 18 16 16 16   Temp:   98.1 F (36.7 C)   TempSrc:   Oral   SpO2:      Weight:      Height:        FHT:  FHR: 120s bpm, variability: minimal ,  accelerations:  Present,  decelerations:  Present prolonged then variable decel UC:  Regular, every 4 minutes SVE:   Dilation: 4.5 Effacement (%): 90 Station: -1 Exam by:: E Wynee Matarazzo CNM  Labs: Lab Results  Component Value Date   WBC 14.6 (H) 02/28/2019   HGB 11.7 (L) 02/28/2019   HCT 35.5 (L) 02/28/2019   MCV 79.2 (L) 02/28/2019   PLT 233 02/28/2019    Assessment / Plan: Induction of labor due to gestational hypertension,  progressing well on pitocin  Labor: Progressing normally Preeclampsia:  no signs or symptoms of toxicity, intake and ouput balanced and labs stable Fetal Wellbeing:  Category II, overall reassuring and improving  Pain  Control:  Epidural I/D:  n/a Anticipated MOD:  NSVD  [name] 03/01/2019, 3:19 AM

## 2019-03-01 NOTE — Progress Notes (Signed)
Labor Progress Note  Kara Cherry, 33 y.o., G1P0, with an IUP @ [redacted]w[redacted]d, presented for IOL for GHTN, neg PreE baseline labs, PCR undetectable.   Subjective: Pt stable in bed, slowly progressing with IOL for GHTN. Pt denies HA, RUQ pain, or vision changes. Talked with pt about AROM with R&B, pt agreeable to AROM and denies questions.  Patient Active Problem List   Diagnosis Date Noted  . Gestational hypertension 02/27/2019   Objective: BP 138/82   Pulse (!) 106   Temp 98.1 F (36.7 C) (Oral)   Resp 16   Ht 5\' 7"  (1.702 m)   Wt 89.4 kg   LMP 06/14/2018   SpO2 98%   BMI 30.85 kg/m  I/O last 3 completed shifts: In: -  Out: 1950 [Urine:1950] No intake/output data recorded. NST: FHR baseline 155s bpm, Variability: moderate, Accelerations:present, Decelerations:  Absent= Cat 1/Reactive CTX:  regular, every 3-4 minutes, lasting 60 seconds Uterus gravid, soft non tender, moderate to palpate with contractions.  SVE:  Dilation: 4.5 Effacement (%): 90 Station: -2 Exam by:: [facility] CNM Pitocin at 6 mUn/min  AROM, bloody show, tolerated well.   Assessment:  Kara Cherry, 33 y.o., G1P0, with an IUP @ [redacted]w[redacted]d, presented for IOL for GHTN, neg Pree baseline labs, PCR undetectable. Pt asymptomatic, no HTN meds. Comfortable with epidural and progress slowly with pitocin. AROM tolerated, clear stable  Patient Active Problem List   Diagnosis Date Noted  . Gestational hypertension 02/27/2019   NICHD: Category 1  Membranes:  AROM @ 0845 on 4/12, bloody, no s/s of infection  Induction:    Cytotec x3 @ 2310 on 4/12, @ 0328 & 0745 on 04/12  Foley Bulb: inserted  and out @ 12 hours  Pitocin - 6  Pain management:               IV pain management: x 2, fentanyl @ 1534 & 1639 on 4/11  Nitrous: No             Epidural placement:  at 8:17PM on 4/11  GBS Negative  GHTN: Stable, no s/sx, BP 124/67   Plan: Continue labor plan Continuous monitoring Rest Frequent position changes  with peanut ball to facilitate fetal rotation and descent. Will reassess with cervical exam in 4 hours or earlier if necessary Continue pitocin per protocol Anticipate labor progression and vaginal delivery.   Md Su Hilt updated.  Dale [place], NP-C, CNM, MSN 03/01/2019. 10:23 AM

## 2019-03-01 NOTE — Progress Notes (Addendum)
Labor Progress Note  Kara Cherry, 33 y.o., G1P0, with an IUP @ [redacted]w[redacted]d, presented for IOL for GHTN, neg PreE baseline labs, PCR undetectable.   Subjective: Pt stable in bed, slowly progressing with IOL for GHTN. Pt denies HA, RUQ pain, or vision changes. Talked with pt about placement of an IUPC, R&B reviewed, pt verbilized consetn and denies any questions.  Patient Active Problem List   Diagnosis Date Noted  . Gestational hypertension 02/27/2019   Objective: BP 139/74   Pulse (!) 105   Temp 99 F (37.2 C) (Oral)   Resp 18   Ht 5\' 7"  (1.702 m)   Wt 89.4 kg   LMP 06/14/2018   SpO2 98%   BMI 30.85 kg/m  I/O last 3 completed shifts: In: -  Out: 1950 [Urine:1950] Total I/O In: -  Out: 1100 [Urine:1100] NST: FHR baseline 135s bpm, Variability: moderate, Accelerations:present, Decelerations:  Absent= Cat 1/Reactive CTX:  regular, every 2-4 minutes, lasting 60-80 seconds Uterus gravid, soft non tender, moderate to palpate with contractions.  SVE:  Dilation: 5.5 Effacement (%): 80 Station: -2 Exam by:: [employer] CNM Pitocin at 20 mUn/min  IUPC placed.    Assessment:  Kara Cherry, 33 y.o., G1P0, with an IUP @ [redacted]w[redacted]d, presented for IOL for GHTN, neg Pree baseline labs, PCR undetectable. Pt asymptomatic, no HTN meds. Comfortable with epidural and progress slowly with pitocin. IUPC placed, due to no cervical changed and pitocin still increasing. RN suspects asynclitism, I performed gentle abdomen effluage in trendelenburg, pt and fetus tolerated well. Pt not in active labor. Plan on performing rebozo at next check if no dilation.  Patient Active Problem List   Diagnosis Date Noted  . Gestational hypertension 02/27/2019   NICHD: Category 1  Membranes:  AROM @ 0845 on 4/12, bloody, no s/s of infection  Induction:    Cytotec x3 @ 2310 on 4/12, @ 0328 & 0745 on 04/12  Foley Bulb: inserted  and out @ 12 hours  Pitocin - 20  IUPC placed: MVU 80-120  Pain management:                IV pain management: x 2, fentanyl @ 1534 & 1639 on 4/11  Nitrous: No             Epidural placement:  at 8:17PM on 4/11  GBS Negative  GHTN: Stable, no s/sx, BP 139/74   Plan: Continue labor plan Continuous monitoring Rest Frequent position changes with peanut ball to facilitate fetal rotation and descent. Will reassess with cervical exam in 4 hours or earlier if necessary Continue pitocin per protocol Anticipate labor progression and vaginal delivery.   Md Su Hilt updated.  Dale [place], NP-C, CNM, MSN 03/01/2019. 3:53 PM

## 2019-03-01 NOTE — Progress Notes (Addendum)
Labor Progress Note  Kara Cherry, 33 y.o., G1P0, with an IUP @ [redacted]w[redacted]d, presented for IOL for GHTN, neg PreE baseline labs, PCR undetectable.   Subjective: Pt stable in bed, tolerated and was appreciative of pitocin pit break, and light laboring snack. Pt sitting up in bed with husband at bedside and still comfortable with epidural. Pt denies HA, RUQ pain, no vision changes. Pt aware of plan and denies any questions.  Patient Active Problem List   Diagnosis Date Noted  . Gestational hypertension 02/27/2019   Objective: BP 127/67   Pulse 99   Temp 99.6 F (37.6 C) (Oral)   Resp 16   Ht 5\' 7"  (1.702 m)   Wt 89.4 kg   LMP 06/14/2018   SpO2 98%   BMI 30.85 kg/m  I/O last 3 completed shifts: In: -  Out: 1950 [Urine:1950] Total I/O In: -  Out: 1100 [Urine:1100] NST: FHR baseline 150s bpm, Variability: moderate, Accelerations:present, Decelerations:  Absent= Cat 1/Reactive CTX: Ir-Regular 2 in 10, lasting 20-40 seconds Uterus gravid, soft non tender, mild to palpate with contractions.  SVE:  Dilation: 5.5 Effacement (%): 80 Station: -2 Exam by:: [employer] CNM Pitocin at (was @ 28, then pit break and restarted now with new bag of pitocin @ 6 mUn/min)  Assessment:  Kara Cherry, 32 y.o., G1P0, with an IUP @ [redacted]w[redacted]d, presented for IOL for GHTN, neg Pree baseline labs, PCR undetectable. Pt asymptomatic, no HTN meds. Comfortable with epidural no cervical change noted, not in active labor yet, bound belly up with sheet to help facilitate fetal descent. Pt sitting upright position, tolerated pitocin break to change bag of pitocin with light maternity snack, restarted pitocin back at 6 now.  Patient Active Problem List   Diagnosis Date Noted  . Gestational hypertension 02/27/2019   NICHD: Category 1  Membranes:  AROM @ 0845 on 4/12, bloody, maternal temp 99.6, maternal HR @ 99, fetus still cat 1, no other s/s of infection  Induction:    Cytotec x3 @ 2310 on 4/12, @ 0328 & 0745  on 04/12  Foley Bulb: inserted  and out @ 12 hours @ 0320 on 04/12  Pitocin - 28 then pit break, changed bag, restarted at 6.   IUPC placed: MVU 20 @ pitocin break  Pain management:               IV pain management: x 2, fentanyl @ 1534 & 1639 on 4/11  Nitrous: No             Epidural placement:  at 8:17PM on 4/11  GBS Negative  GHTN: Stable, no s/sx, BP 134/79   Plan: Continue labor plan Continuous monitoring Rest Frequent position changes with peanut ball to facilitate fetal rotation and descent. Will reassess with cervical exam @MN  or earlier if necessary, decreased vaginal checks to decrease infection. Continue pitocin per protocol, 2x2. Anticipate labor progression and vaginal delivery.   Md Su Hilt updated.  Dale [place], NP-C, CNM, MSN 03/01/2019. 6:50 PM

## 2019-03-01 NOTE — Progress Notes (Signed)
Patient ID: Kara Cherry, female   DOB: [birth year], 33 y.o.   MRN: [id number]  Called by Kathalene Frames, CNM with report of prolonged decel and cat 2 tracing after foley removed and pt now 4-5cm.  Head too high for her to comfortably/safely AROM.  Pitocin held.  Report of tracing resolved and return to cat 1.  Tracing and vitals reviewed by me.

## 2019-03-02 ENCOUNTER — Encounter (HOSPITAL_COMMUNITY)
Admission: AD | Disposition: A | Discharge status: Home / Self Care | Payer: Self-pay | Source: Home / Self Care | Attending: Obstetrics and Gynecology

## 2019-03-02 ENCOUNTER — Encounter (HOSPITAL_COMMUNITY): Payer: Self-pay | Admitting: *Deleted

## 2019-03-02 DIAGNOSIS — Z98891 History of uterine scar from previous surgery: Secondary | ICD-10-CM

## 2019-03-02 LAB — CBC
HCT: 34.9 % — ABNORMAL LOW (ref 36.0–46.0)
Hemoglobin: 11.6 g/dL — ABNORMAL LOW (ref 12.0–15.0)
MCH: 26.2 pg (ref 26.0–34.0)
MCHC: 33.2 g/dL (ref 30.0–36.0)
MCV: 79 fL — ABNORMAL LOW (ref 80.0–100.0)
Platelets: 218 10*3/uL (ref 150–400)
RBC: 4.42 MIL/uL (ref 3.87–5.11)
RDW: 14.7 % (ref 11.5–15.5)
WBC: 19.2 10*3/uL — ABNORMAL HIGH (ref 4.0–10.5)
nRBC: 0 % (ref 0.0–0.2)

## 2019-03-02 LAB — TYPE AND SCREEN
ABO/RH(D): B POS
Antibody Screen: NEGATIVE

## 2019-03-02 SURGERY — Surgical Case
Anesthesia: Epidural

## 2019-03-02 MED ORDER — MORPHINE SULFATE (PF) 0.5 MG/ML IJ SOLN
INTRAMUSCULAR | Status: DC | PRN
  Administered 2019-03-02: 3 mg via EPIDURAL
  Administered 2019-03-02: 2 mg via EPIDURAL

## 2019-03-02 MED ORDER — SIMETHICONE 80 MG PO CHEW: 80.0000 mg | CHEWABLE_TABLET | ORAL | Status: DC | PRN

## 2019-03-02 MED ORDER — ACETAMINOPHEN 160 MG/5ML PO SOLN
650.0000 mg | Freq: Four times a day (QID) | ORAL | Status: DC | PRN
  Administered 2019-03-02 – 2019-03-04 (×3): 650 mg via ORAL
  Filled 2019-03-02 (×3): qty 20.3

## 2019-03-02 MED ORDER — SIMETHICONE 80 MG PO CHEW
80.0000 mg | CHEWABLE_TABLET | ORAL | Status: DC
  Administered 2019-03-02: 80 mg via ORAL
  Filled 2019-03-02 (×2): qty 1

## 2019-03-02 MED ORDER — KETOROLAC TROMETHAMINE 30 MG/ML IJ SOLN
30.0000 mg | Freq: Four times a day (QID) | INTRAMUSCULAR | Status: AC
  Administered 2019-03-02 (×2): 30 mg via INTRAVENOUS
  Filled 2019-03-02 (×3): qty 1

## 2019-03-02 MED ORDER — NALBUPHINE HCL 10 MG/ML IJ SOLN: 5.0000 mg | Freq: Once | INTRAMUSCULAR | Status: DC | PRN

## 2019-03-02 MED ORDER — CEFAZOLIN SODIUM-DEXTROSE 2-4 GM/100ML-% IV SOLN
INTRAVENOUS | Status: AC
  Filled 2019-03-02: qty 100

## 2019-03-02 MED ORDER — PRENATAL MULTIVITAMIN CH: 1.0000 | ORAL_TABLET | Freq: Every day | ORAL | Status: DC

## 2019-03-02 MED ORDER — OXYTOCIN 40 UNITS IN NORMAL SALINE INFUSION - SIMPLE MED: 2.5000 [IU]/h | INTRAVENOUS | Status: AC

## 2019-03-02 MED ORDER — OXYTOCIN 40 UNITS IN NORMAL SALINE INFUSION - SIMPLE MED
INTRAVENOUS | Status: AC
  Filled 2019-03-02: qty 1000

## 2019-03-02 MED ORDER — COMPLETENATE 29-1 MG PO CHEW
1.0000 | CHEWABLE_TABLET | Freq: Every day | ORAL | Status: DC
  Administered 2019-03-03: 1 via ORAL
  Filled 2019-03-02: qty 1

## 2019-03-02 MED ORDER — NALBUPHINE HCL 10 MG/ML IJ SOLN: 5.0000 mg | INTRAMUSCULAR | Status: DC | PRN

## 2019-03-02 MED ORDER — IBUPROFEN 600 MG PO TABS: 600.0000 mg | ORAL_TABLET | Freq: Four times a day (QID) | ORAL | Status: DC | PRN

## 2019-03-02 MED ORDER — IBUPROFEN 100 MG/5ML PO SUSP: 600.0000 mg | Freq: Four times a day (QID) | ORAL | Status: DC | PRN

## 2019-03-02 MED ORDER — SODIUM CHLORIDE 0.9 % IV SOLN
INTRAVENOUS | Status: DC | PRN
  Administered 2019-03-02: 07:00:00 via INTRAVENOUS

## 2019-03-02 MED ORDER — LACTATED RINGERS IV SOLN
INTRAVENOUS | Status: DC | PRN
  Administered 2019-03-02 (×2): via INTRAVENOUS

## 2019-03-02 MED ORDER — PHENYLEPHRINE 40 MCG/ML (10ML) SYRINGE FOR IV PUSH (FOR BLOOD PRESSURE SUPPORT)
PREFILLED_SYRINGE | INTRAVENOUS | Status: DC | PRN
  Administered 2019-03-02 (×2): 40 ug via INTRAVENOUS
  Administered 2019-03-02 (×2): 80 ug via INTRAVENOUS
  Administered 2019-03-02: 40 ug via INTRAVENOUS

## 2019-03-02 MED ORDER — MENTHOL 3 MG MT LOZG: 1.0000 | LOZENGE | OROMUCOSAL | Status: DC | PRN

## 2019-03-02 MED ORDER — OXYCODONE HCL 5 MG PO TABS
5.0000 mg | ORAL_TABLET | Freq: Four times a day (QID) | ORAL | Status: DC | PRN
  Administered 2019-03-03: 5 mg via ORAL
  Administered 2019-03-04 (×2): 10 mg via ORAL
  Filled 2019-03-02 (×2): qty 1
  Filled 2019-03-02: qty 2
  Filled 2019-03-02: qty 1

## 2019-03-02 MED ORDER — ACETAMINOPHEN 10 MG/ML IV SOLN
INTRAVENOUS | Status: AC
  Filled 2019-03-02: qty 100

## 2019-03-02 MED ORDER — ZOLPIDEM TARTRATE 5 MG PO TABS: 5.0000 mg | ORAL_TABLET | Freq: Every evening | ORAL | Status: DC | PRN

## 2019-03-02 MED ORDER — DEXAMETHASONE SODIUM PHOSPHATE 10 MG/ML IJ SOLN
INTRAMUSCULAR | Status: AC
  Filled 2019-03-02: qty 1

## 2019-03-02 MED ORDER — MEDROXYPROGESTERONE ACETATE 150 MG/ML IM SUSP: 150.0000 mg | INTRAMUSCULAR | Status: DC | PRN

## 2019-03-02 MED ORDER — LACTATED RINGERS IV SOLN: INTRAVENOUS | Status: DC

## 2019-03-02 MED ORDER — ONDANSETRON HCL 4 MG/2ML IJ SOLN: 4.0000 mg | Freq: Three times a day (TID) | INTRAMUSCULAR | Status: DC | PRN

## 2019-03-02 MED ORDER — CEFAZOLIN SODIUM-DEXTROSE 2-4 GM/100ML-% IV SOLN: 2.0000 g | INTRAVENOUS | Status: DC

## 2019-03-02 MED ORDER — NALOXONE HCL 0.4 MG/ML IJ SOLN: 0.4000 mg | INTRAMUSCULAR | Status: DC | PRN

## 2019-03-02 MED ORDER — DIPHENHYDRAMINE HCL 25 MG PO CAPS: 25.0000 mg | ORAL_CAPSULE | Freq: Four times a day (QID) | ORAL | Status: DC | PRN

## 2019-03-02 MED ORDER — SENNOSIDES-DOCUSATE SODIUM 8.6-50 MG PO TABS
2.0000 | ORAL_TABLET | ORAL | Status: DC
  Filled 2019-03-02 (×3): qty 2

## 2019-03-02 MED ORDER — DIPHENHYDRAMINE HCL 12.5 MG/5ML PO ELIX
25.0000 mg | ORAL_SOLUTION | Freq: Four times a day (QID) | ORAL | Status: DC | PRN
  Filled 2019-03-02: qty 10

## 2019-03-02 MED ORDER — ALBUTEROL SULFATE (2.5 MG/3ML) 0.083% IN NEBU: 3.0000 mL | INHALATION_SOLUTION | Freq: Four times a day (QID) | RESPIRATORY_TRACT | Status: DC | PRN

## 2019-03-02 MED ORDER — SCOPOLAMINE 1 MG/3DAYS TD PT72: 1.0000 | MEDICATED_PATCH | Freq: Once | TRANSDERMAL | Status: DC

## 2019-03-02 MED ORDER — MORPHINE SULFATE (PF) 0.5 MG/ML IJ SOLN
INTRAMUSCULAR | Status: AC
  Filled 2019-03-02: qty 10

## 2019-03-02 MED ORDER — ACETAMINOPHEN 10 MG/ML IV SOLN
1000.0000 mg | Freq: Once | INTRAVENOUS | Status: DC | PRN
  Administered 2019-03-02: 1000 mg via INTRAVENOUS

## 2019-03-02 MED ORDER — COCONUT OIL OIL
1.0000 "application " | TOPICAL_OIL | Status: DC | PRN
  Administered 2019-03-02: 1 via TOPICAL

## 2019-03-02 MED ORDER — ONDANSETRON HCL 4 MG/2ML IJ SOLN
INTRAMUSCULAR | Status: DC | PRN
  Administered 2019-03-02: 4 mg via INTRAVENOUS

## 2019-03-02 MED ORDER — TETANUS-DIPHTH-ACELL PERTUSSIS 5-2.5-18.5 LF-MCG/0.5 IM SUSP: 0.5000 mL | Freq: Once | INTRAMUSCULAR | Status: DC

## 2019-03-02 MED ORDER — DIPHENHYDRAMINE HCL 50 MG/ML IJ SOLN
12.5000 mg | INTRAMUSCULAR | Status: DC | PRN
  Administered 2019-03-02: 12.5 mg via INTRAVENOUS
  Filled 2019-03-02: qty 1

## 2019-03-02 MED ORDER — WITCH HAZEL-GLYCERIN EX PADS: 1.0000 "application " | MEDICATED_PAD | CUTANEOUS | Status: DC | PRN

## 2019-03-02 MED ORDER — LIDOCAINE 2% (20 MG/ML) 5 ML SYRINGE
INTRAMUSCULAR | Status: AC
  Filled 2019-03-02: qty 5

## 2019-03-02 MED ORDER — ACETAMINOPHEN 325 MG PO TABS: 650.0000 mg | ORAL_TABLET | Freq: Four times a day (QID) | ORAL | Status: DC | PRN

## 2019-03-02 MED ORDER — SIMETHICONE 80 MG PO CHEW
80.0000 mg | CHEWABLE_TABLET | Freq: Three times a day (TID) | ORAL | Status: DC
  Administered 2019-03-02 – 2019-03-04 (×5): 80 mg via ORAL
  Filled 2019-03-02 (×5): qty 1

## 2019-03-02 MED ORDER — CEFAZOLIN SODIUM-DEXTROSE 2-3 GM-%(50ML) IV SOLR
INTRAVENOUS | Status: DC | PRN
  Administered 2019-03-02: 2 g via INTRAVENOUS

## 2019-03-02 MED ORDER — NALOXONE HCL 4 MG/10ML IJ SOLN
1.0000 ug/kg/h | INTRAVENOUS | Status: DC | PRN
  Filled 2019-03-02: qty 5

## 2019-03-02 MED ORDER — DEXAMETHASONE SODIUM PHOSPHATE 4 MG/ML IJ SOLN
INTRAMUSCULAR | Status: DC | PRN
  Administered 2019-03-02: 10 mg via INTRAVENOUS

## 2019-03-02 MED ORDER — SODIUM CHLORIDE 0.9% FLUSH: 3.0000 mL | INTRAVENOUS | Status: DC | PRN

## 2019-03-02 MED ORDER — DIPHENHYDRAMINE HCL 25 MG PO CAPS: 25.0000 mg | ORAL_CAPSULE | ORAL | Status: DC | PRN

## 2019-03-02 MED ORDER — DIBUCAINE (PERIANAL) 1 % EX OINT: 1.0000 "application " | TOPICAL_OINTMENT | CUTANEOUS | Status: DC | PRN

## 2019-03-02 MED ORDER — SOD CITRATE-CITRIC ACID 500-334 MG/5ML PO SOLN
30.0000 mL | ORAL | Status: AC
  Administered 2019-03-02: 30 mL via ORAL

## 2019-03-02 MED ORDER — ONDANSETRON HCL 4 MG/2ML IJ SOLN
INTRAMUSCULAR | Status: AC
  Filled 2019-03-02: qty 2

## 2019-03-02 SURGICAL SUPPLY — 34 items
BENZOIN TINCTURE PRP APPL 2/3 (GAUZE/BANDAGES/DRESSINGS) ×2 IMPLANT
CHLORAPREP W/TINT 26ML (MISCELLANEOUS) ×2 IMPLANT
CLAMP CORD UMBIL (MISCELLANEOUS) IMPLANT
CLOSURE STERI STRIP 1/2 X4 (GAUZE/BANDAGES/DRESSINGS) ×2 IMPLANT
CLOTH BEACON ORANGE TIMEOUT ST (SAFETY) ×2 IMPLANT
DRSG OPSITE POSTOP 4X10 (GAUZE/BANDAGES/DRESSINGS) ×2 IMPLANT
ELECT REM PT RETURN 9FT ADLT (ELECTROSURGICAL) ×2
ELECTRODE REM PT RTRN 9FT ADLT (ELECTROSURGICAL) ×1 IMPLANT
EXTRACTOR VACUUM M CUP 4 TUBE (SUCTIONS) IMPLANT
GLOVE BIO SURGEON STRL SZ7.5 (GLOVE) ×2 IMPLANT
GLOVE BIOGEL PI IND STRL 7.0 (GLOVE) ×1 IMPLANT
GLOVE BIOGEL PI IND STRL 7.5 (GLOVE) ×1 IMPLANT
GLOVE BIOGEL PI INDICATOR 7.0 (GLOVE) ×1
GLOVE BIOGEL PI INDICATOR 7.5 (GLOVE) ×1
GOWN STRL REUS W/TWL LRG LVL3 (GOWN DISPOSABLE) ×4 IMPLANT
KIT ABG SYR 3ML LUER SLIP (SYRINGE) IMPLANT
NEEDLE HYPO 25X5/8 SAFETYGLIDE (NEEDLE) IMPLANT
NS IRRIG 1000ML POUR BTL (IV SOLUTION) ×2 IMPLANT
PACK C SECTION WH (CUSTOM PROCEDURE TRAY) ×2 IMPLANT
PAD OB MATERNITY 4.3X12.25 (PERSONAL CARE ITEMS) ×2 IMPLANT
PENCIL SMOKE EVAC W/HOLSTER (ELECTROSURGICAL) ×2 IMPLANT
RTRCTR C-SECT PINK 25CM LRG (MISCELLANEOUS) ×2 IMPLANT
STRIP CLOSURE SKIN 1/2X4 (GAUZE/BANDAGES/DRESSINGS) ×2 IMPLANT
SUT CHROMIC 2 0 CT 1 (SUTURE) ×2 IMPLANT
SUT MNCRL AB 3-0 PS2 27 (SUTURE) ×2 IMPLANT
SUT PLAIN 2 0 XLH (SUTURE) ×2 IMPLANT
SUT VIC AB 0 CT1 36 (SUTURE) ×2 IMPLANT
SUT VIC AB 0 CTX 36 (SUTURE) ×3
SUT VIC AB 0 CTX36XBRD ANBCTRL (SUTURE) ×3 IMPLANT
SUT VIC AB 2-0 SH 27 (SUTURE) ×2
SUT VIC AB 2-0 SH 27XBRD (SUTURE) ×2 IMPLANT
TOWEL OR 17X24 6PK STRL BLUE (TOWEL DISPOSABLE) ×2 IMPLANT
TRAY FOLEY W/BAG SLVR 14FR LF (SET/KITS/TRAYS/PACK) ×2 IMPLANT
WATER STERILE IRR 1000ML POUR (IV SOLUTION) ×2 IMPLANT

## 2019-03-02 NOTE — Progress Notes (Signed)
Labor Progress Note  Kara Cherry, 33 y.o., G1P0, with an IUP @ [redacted]w[redacted]d, presented for IOL for GHTN, neg PreE baseline labs, PCR undetectable.   Subjective: Pt stable in bed, resting off and on, I talked with pt about failure to progress, reviewed R&B of CS. Pt given option of POC either to continue with induction or procedure with recommended PCS, pt teary eyes and expresses fatigue. Pt verbalized, "I am ready for a CS", The various methods of treatment have been discussed with the patient and family. After consideration of risks, benefits and other options for treatment, the patient has consented to  Procedure(s): Primary CESAREAN SECTION as a surgical intervention for arrest of labor. The patient's history has been reviewed, patient examined, no change in status, stable for surgery.  I have reviewed the patient's chart and labs.  Questions were answered to the patient's satisfaction.   Patient Active Problem List   Diagnosis Date Noted  . Gestational hypertension 02/27/2019   Objective: BP 137/89   Pulse 95   Temp 98.9 F (37.2 C) (Oral)   Resp 18   Ht 5\' 7"  (1.702 m)   Wt 89.4 kg   LMP 06/14/2018   SpO2 98%   BMI 30.85 kg/m  I/O last 3 completed shifts: In: -  Out: 3050 [Urine:3050] Total I/O In: -  Out: 1050 [Urine:1050] NST: FHR baseline 135s bpm, Variability: moderate, Accelerations:present, Decelerations:  Absent= Cat 1/Reactive CTX: Regular 2-3, lasting 60-100 seconds Uterus gravid, soft non tender, mild to palpate with contractions.  SVE:  Dilation: 6 Effacement (%): 80 Station: -1 Exam by:: [employer], CNM  Pitocin at (32 mUn/min)  IUPC MVU: 50s  Assessment:  Kara Cherry, 32 y.o., G1P0, with an IUP @ [redacted]w[redacted]d, presented for IOL for GHTN, neg Pree baseline labs, PCR undetectable. Pt asymptomatic, no HTN meds. Comfortable with epidural, slight cervical change noted, pt on peanut on right side now, continue to increase pitocin, pt expresses fatigue over induction  process. Labor cxt still not adequate. No cervical change noted on exam. R&B reviewed for CS pt endorses wanting to have a CS, please see note in subjective.  Patient Active Problem List   Diagnosis Date Noted  . Gestational hypertension 02/27/2019   NICHD: Category 1  Membranes:  AROM @ 0845 on 4/12, bloody, maternal temp 98.9, maternal HR @ 95, fetus still cat 1, no other s/s of infection  Induction:    Cytotec x3 @ 2310 on 4/12, @ 0328 & 0745 on 04/12  Foley Bulb: inserted  and out @ 12 hours @ 0320 on 04/12  Pitocin - 32  IUPC placed: MVU 150  Pain management:               IV pain management: x 2, fentanyl @ 1534 & 1639 on 4/11  Nitrous: No             Epidural placement:  at 8:17PM on 4/11  GBS Negative  GHTN: Stable, no s/sx, BP 137/89   Plan: Consulted with Dr Su Hilt, recommendation for [facility] for arrest of labor.  Continuous monitoring Stop pitocin Routine [facility] pre-op orders.  SCDs ancef Titrate pitocin to MVU of 200. Anticipate Primary CS for arrest of labor.  Dr Su Hilt aware and in route.    Dale [place], NP-C, CNM, MSN 03/02/2019. 6:17 AM

## 2019-03-02 NOTE — Progress Notes (Signed)
Subjective: Postop Day 0: Cesarean Delivery No complaints.  Pain controlled.  Lochia normal.  Breast feeding yes-pt awaiting lactation consultant.  States she has not tried to latch baby yet.  Objective: Temp:  [98 F (36.7 C)-100.3 F (37.9 C)] 98.9 F (37.2 C) (04/13 1245) Pulse Rate:  [77-115] 84 (04/13 1245) Resp:  [14-39] 18 (04/13 1245) BP: (103-147)/(54-89) 123/80 (04/13 1245) SpO2:  [94 %-100 %] 98 % (04/13 1245)  Physical Exam: Gen: NAD Lochia: Not visualized Uterine Fundus: firm, appropriately tender Incision: clean, dry and intact, healing well DVT Evaluation: + Edema present, no calf tenderness bilaterally   Recent Labs    02/28/19 1937 03/02/19 1001  HGB 11.7* 11.6*  HCT 35.5* 34.9*    Assessment/Plan: Status post C-section-doing well postoperatively. Gestational HTN-BP normal.  H/o anxiety which may be contributing to elevations. Lactation consultant was coming in as I was leaving. Routine post op care.    Geryl Rankins 03/02/2019, 4:46 PM

## 2019-03-02 NOTE — Transfer of Care (Signed)
Immediate Anesthesia Transfer of Care Note  Patient: Kara Cherry  Procedure(s) Performed: CESAREAN SECTION (N/A )  Patient Location: PACU  Anesthesia Type:Epidural  Level of Consciousness: awake, alert  and oriented  Airway & Oxygen Therapy: Patient Spontanous Breathing  Post-op Assessment: Report given to RN and Post -op Vital signs reviewed and stable  Post vital signs: Reviewed and stable  Last Vitals:  Vitals Value Taken Time  BP 127/66 03/02/2019  7:53 AM  Temp    Pulse 98 03/02/2019  7:54 AM  Resp 28 03/02/2019  7:54 AM  SpO2 94 % 03/02/2019  7:54 AM  Vitals shown include unvalidated device data.  Last Pain:  Vitals:   03/02/19 0530  TempSrc:   PainSc: 0-No pain         Complications: No apparent anesthesia complications

## 2019-03-02 NOTE — Addendum Note (Signed)
Addendum  created 03/02/19 1626 by Shanon Payor, CRNA   Clinical Note Signed

## 2019-03-02 NOTE — Lactation Note (Signed)
This note was copied from a baby's chart. Lactation Consultation Note  Patient Name: Kara Cherry Date: 03/02/2019 Reason for consult: Initial assessment;Primapara;1st time breastfeeding;Early term 19-38.6wks  10 hours old early term female who is being exclusively BF by her mother, she's a P1. Mom planned on taking childbirth and BF classes during the pregnancy but they all got cancelled due to COVID-19. [facility] taught mom how to hand express, she was able to get a small droplet of colostrum out of her nipple. Noticed that mom's nipples are flat and they're also hard to compress, may be due to areola edema or just the nature of her tissue. LC set mom up with breast shells and a DEBP (hand pumps are out of stock) instructions, cleaning and storage were reviewed. Mom has a Spectra DEBP at home.  LC offered assistance with latch and mom agreed to have baby STS. LC took baby STS to mom's left breast first in cross cradle position, latching was challenging, baby kept getting off the breast and required constant repositioning, no audible swallows noticed, a lot of clicking noises due to lack of vacuum seal. When LC did suck training noticed that baby was biting and had a very tight grip, she would suck on her tongue too.   Mom asked LC to take baby to the breast again later during [facility] consultation because she kept cueing and this time she requested to try the football hold; she had a c-section and it was uncomfortable doing the cradle. Baby did the exact same thing on football hold, still clicking and latching on and off, with no audible swallows heard. After the 5 minutes mark mom just decided to leave her STS. Asked mom to call for assistance when needed. Reviewed normal newborn behavior, cluster feeding, feeding cues and pumping schedule.  Feeding plan:  1. Encouraged mom to feed baby 8-12 times/24 hours or sooner if feeding cues are present 2. Mom will pump every 3 hours before feedings, to  try to evert her nipples, and she'll offer any amount of EBM she may get.  3. Mom will start wearing her breast shells today, daytime only, she brought a nursing bra to the hospital.  BF brochure, BF resources and feeding diary were reviewed. Mom reported all questions and concerns were answered, she's aware of LC OP services and will call PRN.  Maternal Data Formula Feeding for Exclusion: No Has patient been taught Hand Expression?: Yes Does the patient have breastfeeding experience prior to this delivery?: No  Feeding Feeding Type: Breast Fed  LATCH Score Latch: Repeated attempts needed to sustain latch, nipple held in mouth throughout feeding, stimulation needed to elicit sucking reflex.  Audible Swallowing: None  Type of Nipple: Flat  Comfort (Breast/Nipple): Soft / non-tender  Hold (Positioning): Assistance needed to correctly position infant at breast and maintain latch.  LATCH Score: 5  Interventions Interventions: Breast feeding basics reviewed;Assisted with latch;Skin to skin;Breast massage;Hand express;Breast compression;Adjust position;Support pillows;Position options;Shells;DEBP  Lactation Tools Discussed/Used Tools: Shells;Pump Shell Type: Inverted Breast pump type: Double-Electric Breast Pump WIC Program: No Pump Review: Setup, frequency, and cleaning Initiated by:: MPeck Date initiated:: 03/02/19   Consult Status Consult Status: Follow-up Date: 03/03/19 Follow-up type: In-patient    Kara Cherry 03/02/2019, 5:47 PM

## 2019-03-02 NOTE — Op Note (Signed)
Cesarean Section Procedure Note  Indications: P0 at 38 3/7wks with GHTN admitted for induction d/t cat 2 tracing in MAU that subsequently resolved to cat 1 now with failure to progress.  Pre-operative Diagnosis: 1.38 3/7wks 2.Failure to Progress/Arrest of Dilation  Post-operative Diagnosis: 1.38 3/7wks 2.Failure to Progress/Arrest of Dilation  Procedure: Primary Low Transverse Cesarean Section  Surgeon: Osborn Coho, MD    Assistants: Dale [place], CNM  Anesthesia: Regional  Procedure Details  The patient was taken to the operating room secondary to FTP after the risks, benefits, complications, treatment options, and expected outcomes were discussed with the patient.  The patient concurred with the proposed plan, giving informed consent which was signed and witnessed. The patient was taken to Operating Room B, identified as Kara Cherry and the procedure verified as C-Section Delivery. A Time Out was held and the above information confirmed.  After induction of anesthesia by obtaining a spinal, the patient was prepped and draped in the usual sterile manner. A Pfannenstiel skin incision was made and carried down through the subcutaneous tissue to the underlying layer of fascia.  The fascia was incised bilaterally and extended transversely bilaterally with the Mayo scissors. Kocher clamps were placed on the inferior aspect of the fascial incision and the underlying rectus muscle was separated from the fascia. The same was done on the superior aspect of the fascial incision.  The peritoneum was identified, entered bluntly and extended manually.  An Alexis self-retaining retractor was placed.  The utero-vesical peritoneal reflection was incised transversely and the bladder flap was bluntly freed from the lower uterine segment. A low transverse uterine incision was made with the scalpel and extended bilaterally with the bandage scissors.  The infant was delivered in vertex position without  difficulty.  After the umbilical cord was clamped and cut, the infant was handed to the awaiting pediatricians.  Cord blood was obtained for evaluation.  The placenta was removed intact and appeared to be within normal limits. The uterus was cleared of all clots and debris. The uterine incision was closed with running interlocking sutures of 0 Vicryl and a second imbricating layer was performed as well.   Bilateral tubes and ovaries appeared to be within normal limits.  Good hemostasis was noted.  Copious irrigation was performed until clear.  The peritoneum was repaired with 2-0 chromic via a running suture.  The fascia was reapproximated with a running suture of 0 Vicryl. The subcutaneous tissue was reapproximated with 3 interrupted sutures of 2-0 plain.  The skin was reapproximated with a subcuticular suture of 3-0 monocryl.  Steristrips were applied.  Instrument, sponge, and needle counts were correct prior to abdominal closure and at the conclusion of the case.  The patient was awaiting transfer to the recovery room in good condition.  Findings: Live female infant with Apgars 8 at one minute and 9 at five minutes.  Normal appearing bilateral ovaries and fallopian tubes were noted.  Estimated Blood Loss:  226 ml         Drains: foley to gravity 225 ml         Total IV Fluids: 1300 ml         Specimens to Pathology: n/a         Complications:  None; patient tolerated the procedure well.         Disposition: PACU - hemodynamically stable.         Condition: stable  Attending Attestation: I performed the procedure.

## 2019-03-02 NOTE — Anesthesia Postprocedure Evaluation (Signed)
Anesthesia Post Note  Patient: Kara Cherry  Procedure(s) Performed: CESAREAN SECTION (N/A )     Patient location during evaluation: PACU Anesthesia Type: Epidural Level of consciousness: awake and alert Pain management: pain level controlled Vital Signs Assessment: post-procedure vital signs reviewed and stable Respiratory status: spontaneous breathing, nonlabored ventilation and respiratory function stable Cardiovascular status: stable Postop Assessment: no headache, no backache and epidural receding Anesthetic complications: no    Last Vitals:  Vitals:   03/02/19 1130 03/02/19 1245  BP: 131/82 123/80  Pulse: 84 84  Resp: 20 18  Temp:  37.2 C  SpO2: 99% 98%    Last Pain:  Vitals:   03/02/19 1425  TempSrc:   PainSc: 0-No pain   Pain Goal: Patients Stated Pain Goal: 4 (03/02/19 0920)                 Ladye Macnaughton L Jaythan Hinely

## 2019-03-02 NOTE — Anesthesia Postprocedure Evaluation (Signed)
Anesthesia Post Note  Patient: Gabbriella Stromgren  Procedure(s) Performed: CESAREAN SECTION (N/A )     Patient location during evaluation: Mother Baby Anesthesia Type: Epidural Level of consciousness: awake and alert and oriented Pain management: pain level controlled Vital Signs Assessment: post-procedure vital signs reviewed and stable Respiratory status: spontaneous breathing and nonlabored ventilation Cardiovascular status: stable Postop Assessment: no headache, no backache, patient able to bend at knees, no signs of nausea or vomiting, adequate PO intake and able to ambulate Anesthetic complications: no    Last Vitals:  Vitals:   03/02/19 1130 03/02/19 1245  BP: 131/82 123/80  Pulse: 84 84  Resp: 20 18  Temp:  37.2 C  SpO2: 99% 98%    Last Pain:  Vitals:   03/02/19 1425  TempSrc:   PainSc: 0-No pain   Pain Goal: Patients Stated Pain Goal: 4 (03/02/19 0920)                 Madison Hickman

## 2019-03-02 NOTE — Progress Notes (Signed)
Labor Progress Note  Kara Cherry, 33 y.o., G1P0, with an IUP @ [redacted]w[redacted]d, presented for IOL for GHTN, neg PreE baseline labs, PCR undetectable.   Subjective: Pt stable in bed, resting off and on, RN still increasing pitocin, pt stable husband at bedside, pt started to get fatigued buy the induction process.  Patient Active Problem List   Diagnosis Date Noted  . Gestational hypertension 02/27/2019   Objective: BP 121/79   Pulse 90   Temp 99.1 F (37.3 C) (Oral)   Resp 18   Ht 5\' 7"  (1.702 m)   Wt 89.4 kg   LMP 06/14/2018   SpO2 98%   BMI 30.85 kg/m  I/O last 3 completed shifts: In: -  Out: 3050 [Urine:3050] Total I/O In: -  Out: 700 [Urine:700] NST: FHR baseline 135s bpm, Variability: moderate, Accelerations:present, Decelerations:  Absent= Cat 1/Reactive CTX: Regular 2-3, lasting 60-100 seconds Uterus gravid, soft non tender, mild to palpate with contractions.  SVE:  Dilation: 6 Effacement (%): 80 Station: -1 Exam by:: [employer]  Pitocin at (24 mUn/min)  IUPC MVU: 150s  Assessment:  Kara Cherry, 33 y.o., G1P0, with an IUP @ [redacted]w[redacted]d, presented for IOL for GHTN, neg Pree baseline labs, PCR undetectable. Pt asymptomatic, no HTN meds. Comfortable with epidural, slight cervical change noted, pt on peanut on right side now, continue to increase pitocin, pt expresses fatigue over induction process. Labor cxt still not adequate but starting to increase.  Patient Active Problem List   Diagnosis Date Noted  . Gestational hypertension 02/27/2019   NICHD: Category 1  Membranes:  AROM @ 0845 on 4/12, bloody, maternal temp 99.1, maternal HR @ 90, fetus still cat 1, no other s/s of infection  Induction:    Cytotec x3 @ 2310 on 4/12, @ 0328 & 0745 on 04/12  Foley Bulb: inserted  and out @ 12 hours @ 0320 on 04/12  Pitocin - 24  IUPC placed: MVU 150  Pain management:               IV pain management: x 2, fentanyl @ 1534 & 1639 on 4/11  Nitrous: No             Epidural  placement:  at 8:17PM on 4/11  GBS Negative  GHTN: Stable, no s/sx, BP 121/79   Plan: Continue labor plan Continuous monitoring Rest Frequent position changes with peanut ball to facilitate fetal rotation and descent. Will reassess with cervical exam 0615 or earlier if necessary, decreased vaginal checks to decrease infection. Continue pitocin per protocol, 2x2. Titrate pitocin to MVU of 200. Anticipate labor progression and vaginal delivery.   Md Molly Maduro to be up dated PRN. Dale Komatke, NP-C, CNM, MSN 03/02/2019. 12:52 AM

## 2019-03-03 ENCOUNTER — Encounter (HOSPITAL_COMMUNITY): Payer: Self-pay | Admitting: Obstetrics and Gynecology

## 2019-03-03 LAB — COMPREHENSIVE METABOLIC PANEL
ALT: 19 U/L (ref 0–44)
AST: 36 U/L (ref 15–41)
Albumin: 2 g/dL — ABNORMAL LOW (ref 3.5–5.0)
Alkaline Phosphatase: 95 U/L (ref 38–126)
Anion gap: 7 (ref 5–15)
BUN: 6 mg/dL (ref 6–20)
CO2: 25 mmol/L (ref 22–32)
Calcium: 8.4 mg/dL — ABNORMAL LOW (ref 8.9–10.3)
Chloride: 106 mmol/L (ref 98–111)
Creatinine, Ser: 0.91 mg/dL (ref 0.44–1.00)
GFR calc Af Amer: >60 mL/min (ref >60)
GFR calc non Af Amer: >60 mL/min (ref >60)
Glucose, Bld: 83 mg/dL (ref 70–99)
Potassium: 3.8 mmol/L (ref 3.5–5.1)
Sodium: 138 mmol/L (ref 135–145)
Total Bilirubin: 0.3 mg/dL (ref 0.3–1.2)
Total Protein: 4.5 g/dL — ABNORMAL LOW (ref 6.5–8.1)

## 2019-03-03 LAB — CBC
HCT: 28.5 % — ABNORMAL LOW (ref 36.0–46.0)
Hemoglobin: 9.6 g/dL — ABNORMAL LOW (ref 12.0–15.0)
MCH: 27 pg (ref 26.0–34.0)
MCHC: 33.7 g/dL (ref 30.0–36.0)
MCV: 80.1 fL (ref 80.0–100.0)
Platelets: 171 10*3/uL (ref 150–400)
RBC: 3.56 MIL/uL — ABNORMAL LOW (ref 3.87–5.11)
RDW: 15.1 % (ref 11.5–15.5)
WBC: 11.5 10*3/uL — ABNORMAL HIGH (ref 4.0–10.5)
nRBC: 0 % (ref 0.0–0.2)

## 2019-03-03 MED ORDER — IBUPROFEN 100 MG/5ML PO SUSP
600.0000 mg | Freq: Four times a day (QID) | ORAL | Status: DC | PRN
  Administered 2019-03-03 – 2019-03-04 (×4): 600 mg via ORAL
  Filled 2019-03-03 (×4): qty 30

## 2019-03-03 MED ORDER — POLYETHYLENE GLYCOL 3350 17 G PO PACK
17.0000 g | PACK | Freq: Every day | ORAL | Status: DC | PRN
  Administered 2019-03-04: 17 g via ORAL
  Filled 2019-03-03: qty 1

## 2019-03-03 NOTE — Lactation Note (Signed)
This note was copied from a baby's chart. Lactation Consultation Note  Patient Name: Kara Cherry Date: 03/03/2019 Reason for consult: Follow-up assessment;Infant weight loss;Primapara;1st time breastfeeding;Early term 54-38.6wks  30 hours old early term female who is still being mostly BF by her mother, she's a P1. Baby had 2 ml of Similac formula this morning, but mom told LC that was a one time thing and she wouldn't like to add it to [employer] feeding plan. She's pumping now and able to get some drops of colostrum, baby is at 3% weight loss.   LC had to come back to the room twice because mom was in the shower. The second time baby was ready to feed, LC took baby STS to mom's right breast in football position and after a few tries she was able to latch. Mom's nipples and areola complex look remarkably better today. The edema has started to subside; mom started wearing her shells this morning and now her nipples look semi-flat, slightly "everted". Her tissue has improved also, and is no longer non-compressible, now it's semi-compressible, explained to mom that it's going to take some work but we can make it happen and that she was on a great path, praised her for her efforts.  She has only pumped once today, stressed the importance of consistent pumping, mom agreed. Reviewed cluster feeding and feeding cues again as well as pumping schedule. Baby still nursing when exiting the room, showed mom how to break the latch in case she feels like she needs to.  Feeding plan:  1. Encouraged mom to feed baby STS 8-12 times/24 hours or sooner if feeding cues are present 2. Mom will pump every 3 hours before feedings, to try to evert her nipples, and will keep offering any amount of EBM she may get.  3. She'll continue wearing her shells during daytime only  Parents reported all questions and concerns were answered, they're both aware of LC services and will call PRN.  Maternal Data     Feeding Feeding Type: Breast Fed  LATCH Score Latch: Repeated attempts needed to sustain latch, nipple held in mouth throughout feeding, stimulation needed to elicit sucking reflex.  Audible Swallowing: A few with stimulation  Type of Nipple: Flat  Comfort (Breast/Nipple): Soft / non-tender  Hold (Positioning): Assistance needed to correctly position infant at breast and maintain latch.  LATCH Score: 6  Interventions Interventions: Breast feeding basics reviewed;Assisted with latch;Skin to skin;Breast massage;Hand express;Breast compression;Adjust position;Support pillows;Shells;DEBP  Lactation Tools Discussed/Used     Consult Status Consult Status: Follow-up Date: 03/04/19 Follow-up type: In-patient    Eathon Valade Venetia Constable 03/03/2019, 1:24 PM

## 2019-03-03 NOTE — Progress Notes (Signed)
Subjective: Postop Day 1: Cesarean Delivery Pt resting comfortably in bed.  Pain controlled.  Lochia normal.  Tolerating gen diet.  +flatus, no BM.  Lochia moderate and improved.  No F/C/CP/SOB.  No acute complaints  Objective: Temp:  [97.9 F (36.6 C)-99.2 F (37.3 C)] 97.9 F (36.6 C) (04/14 0230) Pulse Rate:  [75-101] 98 (04/14 0650) Resp:  [14-39] 16 (04/14 0650) BP: (111-132)/(66-83) 131/82 (04/14 0650) SpO2:  [94 %-100 %] 100 % (04/14 0650)  Physical Exam: Gen: NAD CV: RRR Lungs: CTAB Lochia: Not visualized Uterine Fundus: firm, non-tender, below umbilicus Incision: clean, dry and intact, healing well DVT Evaluation: minimal edema, no calf tenderness bilaterally  CBC Latest Ref Rng & Units 03/03/2019 03/02/2019 02/28/2019  WBC 4.0 - 10.5 K/uL 11.5(H) 19.2(H) 14.6(H)  Hemoglobin 12.0 - 15.0 g/dL 6.7(E) 11.6(L) 11.7(L)  Hematocrit 36.0 - 46.0 % 28.5(L) 34.9(L) 35.5(L)  Platelets 150 - 400 K/uL 171 218 233     Assessment/Plan: 33yo G1P1 s/p primary C-section, POD#1 -doing well postoperatively Gestational HTN-BP normal.  H/o anxiety which may be contributing to elevations. Lactation consultant as needed Routine post op care.    Alessandra Bevels Gillian Meeuwsen 03/03/2019, 7:22 AM

## 2019-03-03 NOTE — Addendum Note (Signed)
Addendum  created 03/03/19 1211 by Lucretia Kern, MD   Intraprocedure Event edited

## 2019-03-04 ENCOUNTER — Ambulatory Visit: Payer: Self-pay

## 2019-03-04 MED ORDER — IBUPROFEN 100 MG/5ML PO SUSP: 600.0000 mg | Freq: Four times a day (QID) | ORAL | 0 refills | Status: DC | PRN

## 2019-03-04 MED ORDER — ALBUTEROL SULFATE HFA 108 (90 BASE) MCG/ACT IN AERS: 1.0000 | INHALATION_SPRAY | Freq: Four times a day (QID) | RESPIRATORY_TRACT | 0 refills | Status: DC | PRN

## 2019-03-04 MED ORDER — OXYCODONE HCL 5 MG PO TABS: 5.0000 mg | ORAL_TABLET | Freq: Four times a day (QID) | ORAL | 0 refills | Status: DC | PRN

## 2019-03-04 NOTE — Discharge Summary (Signed)
OB Discharge Summary     Patient Name: Kara BelfastChelsea Reiley DOB: 03/11/1986 MRN: [id number]  Date of admission: 02/27/2019 Delivering MD: Osborn CohoOBERTS, ANGELA   Date of discharge: 03/04/2019  Admitting diagnosis: 37wks, HBP Intrauterine pregnancy: 3828w3d     Secondary diagnosis:  Active Problems:   Gestational hypertension   S/P primary low transverse C-section  Additional problems: None     Discharge diagnosis: Term Pregnancy Delivered and CHTN                                                                                                Post partum procedures:none  Augmentation: AROM, Pitocin, Cytotec and Foley Balloon  Complications: None  Hospital course:  Induction of Labor With Cesarean Section  33 y.o. yo G1P1001 at 7328w3d was admitted to the hospital 02/27/2019 for induction of labor. Patient had a labor course significant for cytotec x 3 foley bulb AROM and pitocine . The patient went for cesarean section due to Arrest of Dilation, and delivered a Viable infant,03/02/2019  Membrane Rupture Time/Date: 8:44 AM ,03/01/2019   Details of operation can be found in separate operative Note.  Patient had an uncomplicated postpartum course. She is ambulating, tolerating a regular diet, passing flatus, and urinating well.  Patient is discharged home in stable condition on 03/04/19.                                    Physical exam  Vitals:   03/03/19 0650 03/03/19 1447 03/03/19 2126 03/04/19 0540  BP: 131/82 122/81 (!) 146/93 140/85  Pulse: 98 81 82 79  Resp: 16 18 18 18   Temp:  98.2 F (36.8 C) 98.6 F (37 C) 98.4 F (36.9 C)  TempSrc:  Oral Oral Oral  SpO2: 100% 100% 100%   Weight:      Height:       General: alert, cooperative and no distress Lochia: appropriate Uterine Fundus: firm Incision: Healing well with no significant drainage DVT Evaluation: No evidence of DVT seen on physical exam. Labs: Lab Results  Component Value Date   WBC 11.5 (H) 03/03/2019   HGB 9.6 (L)  03/03/2019   HCT 28.5 (L) 03/03/2019   MCV 80.1 03/03/2019   PLT 171 03/03/2019   CMP Latest Ref Rng & Units 03/03/2019  Glucose 70 - 99 mg/dL 83  BUN 6 - 20 mg/dL 6  Creatinine 0.100.44 - 9.321.00 mg/dL 3.550.91  Sodium 732135 - 202145 mmol/L 138  Potassium 3.5 - 5.1 mmol/L 3.8  Chloride 98 - 111 mmol/L 106  CO2 22 - 32 mmol/L 25  Calcium 8.9 - 10.3 mg/dL 5.4(Y8.4(L)  Total Protein 6.5 - 8.1 g/dL 7.0(W4.5(L)  Total Bilirubin 0.3 - 1.2 mg/dL 0.3  Alkaline Phos 38 - 126 U/L 95  AST 15 - 41 U/L 36  ALT 0 - 44 U/L 19    Discharge instruction: per After Visit Summary and "Baby and Me Booklet".  After visit meds:   Diet: low salt diet  Activity: Advance as tolerated. Pelvic rest for 6  weeks.   Outpatient follow up:2 weeks Follow up Appt:No future appointments. Follow up Visit:No follow-ups on file.  Postpartum contraception: Not Discussed  Newborn Data: Live born female  Birth Weight: 6 lb 4.2 oz (2840 g) APGAR: 8, 9  Newborn Delivery   Birth date/time:  03/02/2019 07:08:00 Delivery type:  C-Section, Low Transverse Trial of labor:  Yes C-section categorization:  Primary     Baby Feeding: Breast Disposition:home with mother   03/04/2019 Gerald Leitz, MD

## 2019-03-04 NOTE — Lactation Note (Addendum)
This note was copied from a baby's chart. Lactation Consultation Note:  Infant is 57 hours old. Mother is desiring to be discharged early.  Mother reports that she last pumped at 8am and didn't get any drops.  Mother formula fed infant 20 ml of Similac the last feeding.   Assist mother with hand expression and observed several drops of colostrum. Mother became teary when she saw colostrum.   Mother nipples and breast are sore . She feels from the shells.  Discussed treatment and prevention of engorgement. mother taught to do good breast massage now to get milk flowing. Advised taking a break from the shells.  Mother reports that she last breastfed infant yesterday but she felt that infant was only getting on the tip of the nipple.   Discussed and assist with reverse pressure. Mothers nipples are short but erect at this time. Areola tissue is soft and compressible.   Discussed the use of a NS if needed. Lots of teaching on breastfeeding and latch technique. Discussed supply and demand.  Mother and Father receptive to all teaching.   Suggested that mother practice hand expression and then pump for 15-20 mins. Mother reports that she has a Spectra pump at home and a hand pump came with the kit.   Mother request assistance with latching with next feeding. Mother will page when infant cues and or next feeding attempt.    Patient Name: Kara Cherry HXTAV'W Date: 03/04/2019 Reason for consult: Follow-up assessment   Maternal Data    Feeding Feeding Type: Formula Nipple Type: Slow - flow  LATCH Score                   Interventions    Lactation Tools Discussed/Used Tools: Shells;Pump Breast pump type: Other (comment)(Spectra at home)   Consult Status Consult Status: Follow-up Date: 03/04/19 Follow-up type: In-patient    Jess Barters [facility] 03/04/2019, 10:35 AM

## 2019-03-04 NOTE — Lactation Note (Signed)
This note was copied from a baby's chart. Lactation Consultation Note LC asked mom if she needed assistance w/BF noted she has been giving formula. Mom stated she hasn't felt like BF this evening d/t her incision hurting. Mom stated she may try in the morning to BF but tonight she was going to cont. To formula feed. Asked mom to call for [facility] assistance for latching if needed or has any questions.   Patient Name: Kara Cherry CHENI'D Date: 03/04/2019     Maternal Data    Feeding    LATCH Score                   Interventions    Lactation Tools Discussed/Used     Consult Status      Charyl Dancer 03/04/2019, 3:35 AM

## 2019-03-04 NOTE — Lactation Note (Signed)
This note was copied from a baby's chart. Lactation Consultation Note:  Mother paged for  Assistance with latching infant.  Mother reports that her rt areola is still sore from shells.   Assist mother with placing infant in football hold on the left breast.  Infant latched with a shallow latch at first and observed on and off tugging. Lots of teaching done at this time.  Observed pinching when infant released the breast.   Mother was fit with a #20 NS. Infant tugged with strong rhythmic suckling but was unable to flange infants lips for wide gape. Top lip rolled up well but unable to do chin tug . Infant sustained latch for 10 mins and observed milk in the nipple shield.  Mother advised to offer breast first but if unable to get good depth and observed pinching to use the NS.  #5 fr feeding tube was placed at breast without the nipple shield. Infant took 10 ml.  Infant placed in cross cradle hold and then took another 6 from feeding tube.   Parents were given Medela SNS to use if desired.  Advised to breastfeed, supplement infant and to post pump.  Mother receptive to all teaching. Father very supportive.   Lc did observe that infant has a short tight lingual fremula.  Infants tongue curves on all sides like a bowl.  Discussed this briefly with parent. They were given numbers of Specialist to seek if continued difficulty with latch.   Mother to follow up with Out patient services.   Mother request that she have some written instructions about all teaching that [facility] did with her.  LC plan of care was written out for mother. Mother was in the shower and I went over plan with father and advised for Mother to page me if she had any questions.   Reviewed all basic teaching with mother. Mother aware of number to call if any  Breastfeeding concerns or questions.   Patient Name: Kara Cherry PJASN'K Date: 03/04/2019 Reason for consult: Follow-up assessment   Maternal Data     Feeding Feeding Type: Breast Fed  LATCH Score Latch: Grasps breast easily, tongue down, lips flanged, rhythmical sucking.  Audible Swallowing: Spontaneous and intermittent  Type of Nipple: Everted at rest and after stimulation(short shaff)  Comfort (Breast/Nipple): Filling, red/small blisters or bruises, mild/mod discomfort  Hold (Positioning): Assistance needed to correctly position infant at breast and maintain latch.  LATCH Score: 8  Interventions Interventions: Assisted with latch;Skin to skin;Breast massage;Hand express;Reverse pressure;Breast compression;Support pillows;Position options;Expressed milk;Shells;Comfort gels;DEBP  Lactation Tools Discussed/Used Tools: 61F feeding tube / Syringe Nipple shield size: 20 Shell Type: Inverted Breast pump type: Double-Electric Breast Pump   Consult Status Consult Status: Complete    Michel Bickers 03/04/2019, 3:09 PM

## 2019-04-30 ENCOUNTER — Encounter: Payer: Self-pay | Admitting: Family Medicine

## 2019-04-30 ENCOUNTER — Ambulatory Visit: Payer: 59 | Admitting: Family Medicine

## 2019-04-30 ENCOUNTER — Other Ambulatory Visit: Payer: Self-pay

## 2019-04-30 VITALS — BP 143/80 | HR 76 | Temp 98.8°F | Ht 67.0 in | Wt 173.8 lb

## 2019-04-30 DIAGNOSIS — M25532 Pain in left wrist: Secondary | ICD-10-CM

## 2019-04-30 DIAGNOSIS — M79645 Pain in left finger(s): Secondary | ICD-10-CM

## 2019-04-30 MED ORDER — MELOXICAM 15 MG PO TABS: 15.0000 mg | ORAL_TABLET | Freq: Every day | ORAL | 0 refills | Status: AC

## 2019-04-30 NOTE — Progress Notes (Signed)
Acute Office Visit  Subjective:    Patient ID: Kara Cherry, female    DOB: May 03, 1986, 33 y.o.   MRN: [id number]  Chief Complaint  Patient presents with  . left wrist pain    started tue and sharp pain while picking up new born daughter(11lb). sharp pain    HPI Patient is here today for sharp pain in the left wrist-painful to rotate or write. Pt has an 97 week old baby- that she picked up and felt a sharp pain. Pt has been using motrin prn and ice. Pt states she had carpal tunnel during pregnancy and has splints. Pt with no prior injury to her hand.  Pt with bilat knee pain-worse since delivery. 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 Pt is left hand dominant but can use both hands.  No numbness in either hand.   Past Medical History:  Diagnosis Date  . Headache   . Hypertension   . Vaginal Pap smear, abnormal     Past Surgical History:  Procedure Laterality Date  . CESAREAN SECTION N/A 03/02/2019   Procedure: CESAREAN SECTION;  Surgeon: Everett Graff, MD;  Location: [facility];  Service: Obstetrics;  Laterality: N/A;  . WISDOM TOOTH EXTRACTION      Family History  Problem Relation Age of Onset  . Hypertension Mother   . Hypertension Father   . Heart disease Paternal Grandmother   . Stroke Paternal Grandmother   . Diabetes Paternal Grandfather   . Heart disease Paternal Grandfather     Social History   Socioeconomic History  . Marital status: Married    Spouse name: Not on file  . Number of children: Not on file  . Years of education: Not on file  . Highest education level: Not on file  Occupational History  . Not on file  Social Needs  . Financial resource strain: Not on file  . Food insecurity    Worry: Not on file    Inability: Not on file  . Transportation needs    Medical: Not on file    Non-medical: Not on file  Tobacco Use  . Smoking status: Never Smoker  . Smokeless tobacco: Never Used  Substance and Sexual Activity  .  Alcohol use: Not Currently    Comment: not while preg  . Drug use: No  . Sexual activity: Yes  Lifestyle  . Physical activity    Days per week: Not on file    Minutes per session: Not on file  . Stress: Not on file  Relationships  . Social Herbalist on phone: Not on file    Gets together: Not on file    Attends religious service: Not on file    Active member of club or organization: Not on file    Attends meetings of clubs or organizations: Not on file    Relationship status: Not on file  . Intimate partner violence    Fear of current or ex partner: Not on file    Emotionally abused: Not on file    Physically abused: Not on file    Forced sexual activity: Not on file  Other Topics Concern  . Not on file  Social History Narrative  . Not on file    Outpatient Medications Prior to Visit  Medication Sig Dispense Refill  . calcium carbonate (TUMS - DOSED IN MG ELEMENTAL CALCIUM) 500 MG chewable tablet Chew 2 tablets by mouth  3 (three) times daily as needed for indigestion or heartburn.    Marland Kitchen. ibuprofen (ADVIL,MOTRIN) 100 MG/5ML suspension Take 30 mLs (600 mg total) by mouth every 6 (six) hours as needed (headache, mild pain, moderate pain, cramping). 900 mL 0  . loratadine (CLARITIN) 10 MG tablet Take 10 mg by mouth daily.    Marland Kitchen. spironolactone (ALDACTONE) 100 MG tablet     . albuterol (PROVENTIL HFA;VENTOLIN HFA) 108 (90 Base) MCG/ACT inhaler Inhale 1-2 puffs into the lungs every 6 (six) hours as needed for wheezing or shortness of breath. 1 Inhaler 0  . clindamycin-benzoyl peroxide (BENZACLIN) gel Apply topically 2 (two) times daily.    . Multiple Vitamins-Minerals (EMERGEN-C IMMUNE PLUS/VIT D) CHEW Chew 3 each by mouth daily.    Marland Kitchen. oxyCODONE (OXY IR/ROXICODONE) 5 MG immediate release tablet Take 1-2 tablets (5-10 mg total) by mouth every 6 (six) hours as needed for moderate pain, severe pain or breakthrough pain. 30 tablet 0  . Prenatal Vit-Fe Fumarate-FA (PRENATAL  MULTIVITAMIN) TABS tablet Take 1 tablet by mouth daily at 12 noon.     No facility-administered medications prior to visit.     Allergies  Allergen Reactions  . Sulfa Antibiotics Hives    Review of Systems  Musculoskeletal: Positive for joint pain.  Neurological: Positive for focal weakness.  pain in the left radial wrist and primary MP joint to palpation     Objective:    Physical Exam  Constitutional: She appears well-developed and well-nourished.  Musculoskeletal:        General: Tenderness present.  bilat elbows FROM, wrists-tenderness radial to palpation and resistance, no swelling  BP (!) 143/80 (BP Location: Right Arm, Patient Position: Sitting, Cuff Size: Normal)   Pulse 76   Temp 98.8 F (37.1 C) (Oral)   Ht 5\' 7"  (1.702 m)   Wt 173 lb 12.8 oz (78.8 kg)   SpO2 98%   BMI 27.22 kg/m  Wt Readings from Last 3 Encounters:  04/30/19 173 lb 12.8 oz (78.8 kg)  02/27/19 197 lb (89.4 kg)  12/01/18 175 lb (79.4 kg)    Health Maintenance Due  Topic Date Due  . TETANUS/TDAP  08/24/2007  1. Thumb pain, left  - Ambulatory referral to Hand Surgery - Wrist splint  2. Wrist pain, left Wrist splint Ice , elevation, mobic-risk/benefit/side effects-hand surgery if no improvement Likely sprain  D/w pt rehab post delivery-suggestion-exercise bike to avoid further knee impact-pt with bilat knee pain episodically. Consider yoga and aqua therapy  Lab Results  Component Value Date   TSH 0.798 10/07/2017   Lab Results  Component Value Date   WBC 11.5 (H) 03/03/2019   HGB 9.6 (L) 03/03/2019   HCT 28.5 (L) 03/03/2019   MCV 80.1 03/03/2019   PLT 171 03/03/2019   Lab Results  Component Value Date   NA 138 03/03/2019   K 3.8 03/03/2019   CO2 25 03/03/2019   GLUCOSE 83 03/03/2019   BUN 6 03/03/2019   CREATININE 0.91 03/03/2019   BILITOT 0.3 03/03/2019   ALKPHOS 95 03/03/2019   AST 36 03/03/2019   ALT 19 03/03/2019   PROT 4.5 (L) 03/03/2019   ALBUMIN 2.0 (L)  03/03/2019   CALCIUM 8.4 (L) 03/03/2019   ANIONGAP 7 03/03/2019   Lab Results  Component Value Date   CHOL 177 10/07/2017   Lab Results  Component Value Date   HDL 61 10/07/2017   Lab Results  Component Value Date   LDLCALC 101 (H) 10/07/2017   Lab  Results  Component Value Date   TRIG 73 10/07/2017   Lab Results  Component Value Date   CHOLHDL 2.9 10/07/2017   No results found for: HGBA1C     Assessment & Plan:   Problem List Items Addressed This Visit    None       No orders of the defined types were placed in this encounter.    Deryk Bozman Mat CarneLEIGH Peta Peachey, MD

## 2019-04-30 NOTE — Patient Instructions (Addendum)
     If you have lab work done today you will be contacted with your lab results within the next 2 weeks.  If you have not heard from [place] then please contact us. The fastest way to get your results is to register for My Chart.  Wrist splint -wear as much as possible during the day mobic-pick up at pharmacy Hand surgery referral for continued pain IF you received an x-ray today, you will receive an invoice from [facility]. Please contact [facility] at [phone] with questions or concerns regarding your invoice.   IF you received labwork today, you will receive an invoice from [facility]. Please contact [facility] at [phone] with questions or concerns regarding your invoice.   Our billing staff will not be able to assist you with questions regarding bills from these companies.  You will be contacted with the lab results as soon as they are available. The fastest way to get your results is to activate your My Chart account. Instructions are located on the last page of this paperwork. If you have not heard from [place] regarding the results in 2 weeks, please contact this office.

## 2019-05-13 ENCOUNTER — Ambulatory Visit: Payer: 59 | Admitting: Orthopaedic Surgery

## 2019-11-27 ENCOUNTER — Encounter: Payer: 59 | Admitting: Family Medicine

## 2019-11-30 ENCOUNTER — Encounter: Payer: 59 | Admitting: Family Medicine

## 2020-01-01 ENCOUNTER — Other Ambulatory Visit: Payer: Self-pay | Admitting: Obstetrics and Gynecology

## 2020-01-01 DIAGNOSIS — N6310 Unspecified lump in the right breast, unspecified quadrant: Secondary | ICD-10-CM

## 2020-01-14 ENCOUNTER — Other Ambulatory Visit: Payer: Self-pay | Admitting: Obstetrics and Gynecology

## 2020-01-14 ENCOUNTER — Other Ambulatory Visit: Payer: Self-pay

## 2020-01-14 ENCOUNTER — Ambulatory Visit
Admission: RE | Admit: 2020-01-14 | Discharge: 2020-01-14 | Disposition: A | Discharge status: Home / Self Care | Payer: 59 | Source: Ambulatory Visit | Attending: Obstetrics and Gynecology | Admitting: Obstetrics and Gynecology

## 2020-01-14 DIAGNOSIS — N6310 Unspecified lump in the right breast, unspecified quadrant: Secondary | ICD-10-CM

## 2020-01-27 ENCOUNTER — Encounter: Payer: Self-pay | Admitting: Family Medicine

## 2020-01-27 ENCOUNTER — Ambulatory Visit (INDEPENDENT_AMBULATORY_CARE_PROVIDER_SITE_OTHER): Payer: 59 | Admitting: Family Medicine

## 2020-01-27 ENCOUNTER — Other Ambulatory Visit: Payer: Self-pay

## 2020-01-27 ENCOUNTER — Other Ambulatory Visit (HOSPITAL_COMMUNITY)
Admission: RE | Admit: 2020-01-27 | Discharge: 2020-01-27 | Disposition: A | Discharge status: Home / Self Care | Payer: 59 | Source: Ambulatory Visit | Attending: Family Medicine | Admitting: Family Medicine

## 2020-01-27 VITALS — BP 122/86 | HR 59 | Temp 97.4°F | Resp 15 | Ht 67.0 in | Wt 176.0 lb

## 2020-01-27 DIAGNOSIS — Z0000 Encounter for general adult medical examination without abnormal findings: Secondary | ICD-10-CM

## 2020-01-27 DIAGNOSIS — Z124 Encounter for screening for malignant neoplasm of cervix: Secondary | ICD-10-CM

## 2020-01-27 NOTE — Progress Notes (Signed)
Chief Complaint  Patient presents with  . Annual Exam    pt feeling well today no concerns     Subjective:  Kara Cherry is a 34 y.o. female here for a health maintenance visit.  Patient is established pt  Patient reports that she had a mammogram for a breast lump that was birads3 She denies any pain from the breast She will get repeat mammogram in August 2021 She has family history of breast cancer in a Greataunt. She has an 51 month old     Patient Active Problem List   Diagnosis Date Noted  . Thumb pain, left 04/30/2019  . Wrist pain, left 04/30/2019  . S/P primary low transverse C-section 03/02/2019  . Gestational hypertension 02/27/2019    Past Medical History:  Diagnosis Date  . Headache   . Hypertension    Gestatinal  . Vaginal Pap smear, abnormal     Past Surgical History:  Procedure Laterality Date  . CESAREAN SECTION N/A 03/02/2019   Procedure: CESAREAN SECTION;  Surgeon: Everett Graff, MD;  Location: [facility] LD ORS;  Service: Obstetrics;  Laterality: N/A;  . WISDOM TOOTH EXTRACTION       Outpatient Medications Prior to Visit  Medication Sig Dispense Refill  . calcium carbonate (TUMS - DOSED IN MG ELEMENTAL CALCIUM) 500 MG chewable tablet Chew 2 tablets by mouth 3 (three) times daily as needed for indigestion or heartburn.    . loratadine (CLARITIN) 10 MG tablet Take 10 mg by mouth daily.    . meloxicam (MOBIC) 15 MG tablet Take 1 tablet (15 mg total) by mouth daily. (Patient not taking: Reported on 01/27/2020) 30 tablet 0  . spironolactone (ALDACTONE) 100 MG tablet      No facility-administered medications prior to visit.    Allergies  Allergen Reactions  . Sulfa Antibiotics Hives     Family History  Problem Relation Age of Onset  . Hypertension Mother   . Hypertension Father   . Heart disease Paternal Grandmother   . Stroke Paternal Grandmother   . Diabetes Paternal Grandfather   . Heart disease Paternal Grandfather      Health  Habits: Dental Exam: not up to date Eye Exam: up to date Exercise: times/week on average Current exercise activities: walking/running Diet: balanced  Social History   Socioeconomic History  . Marital status: Married    Spouse name: Not on file  . Number of children: Not on file  . Years of education: Not on file  . Highest education level: Not on file  Occupational History  . Not on file  Tobacco Use  . Smoking status: Never Smoker  . Smokeless tobacco: Never Used  Substance and Sexual Activity  . Alcohol use: Yes    Alcohol/week: 2.0 standard drinks    Types: 2 Glasses of wine per week  . Drug use: No  . Sexual activity: Yes  Other Topics Concern  . Not on file  Social History Narrative  . Not on file   Social Determinants of Health   Financial Resource Strain:   . Difficulty of Paying Living Expenses: Not on file  Food Insecurity:   . Worried About Charity fundraiser in the Last Year: Not on file  . Ran Out of Food in the Last Year: Not on file  Transportation Needs:   . Lack of Transportation (Medical): Not on file  . Lack of Transportation (Non-Medical): Not on file  Physical Activity:   . Days of Exercise per Week: Not  on file  . Minutes of Exercise per Session: Not on file  Stress:   . Feeling of Stress : Not on file  Social Connections:   . Frequency of Communication with Friends and Family: Not on file  . Frequency of Social Gatherings with Friends and Family: Not on file  . Attends Religious Services: Not on file  . Active Member of Clubs or Organizations: Not on file  . Attends Archivist Meetings: Not on file  . Marital Status: Not on file  Intimate Partner Violence:   . Fear of Current or Ex-Partner: Not on file  . Emotionally Abused: Not on file  . Physically Abused: Not on file  . Sexually Abused: Not on file   Social History   Substance and Sexual Activity  Alcohol Use Yes  . Alcohol/week: 2.0 standard drinks  . Types: 2  Glasses of wine per week   Social History   Tobacco Use  Smoking Status Never Smoker  Smokeless Tobacco Never Used   Social History   Substance and Sexual Activity  Drug Use No    GYN: Sexual Health Menstrual status: regular menses LMP: Patient's last menstrual period was 01/13/2020. Last pap smear: see HM section History of abnormal pap smears:  Sexually active: with female partner Current contraception: Skylar IUD  Health Maintenance: See under health Maintenance activity for review of completion dates as well. Immunization History  Administered Date(s) Administered  . Influenza-Unspecified 09/09/2019  . Tdap 09/09/2019      Depression Screen-PHQ2/9 Depression screen [facility] 2/9 01/27/2020 04/30/2019 07/03/2018 10/07/2017  Decreased Interest 0 0 0 0  Down, Depressed, Hopeless 0 0 0 0  PHQ - 2 Score 0 0 0 0       Depression Severity and Treatment Recommendations:  0-4= None  5-9= Mild / Treatment: Support, educate to call if worse; return in one month  10-14= Moderate / Treatment: Support, watchful waiting; Antidepressant or Psycotherapy  15-19= Moderately severe / Treatment: Antidepressant OR Psychotherapy  >= 20 = Major depression, severe / Antidepressant AND Psychotherapy    Review of Systems   ROS  See HPI for ROS as well.   Review of Systems  Constitutional: Negative for activity change, appetite change, chills and fever.  HENT: Negative for congestion, nosebleeds, trouble swallowing and voice change.   Respiratory: Negative for cough, shortness of breath and wheezing.   Gastrointestinal: Negative for diarrhea, nausea and vomiting.  Genitourinary: Negative for difficulty urinating, dysuria, flank pain and hematuria.  Musculoskeletal: Negative for back pain, joint swelling and neck pain.  Neurological: Negative for dizziness, speech difficulty, light-headedness and numbness.  See HPI. All other review of systems negative.    Objective:   Vitals:    01/27/20 0813  BP: 122/86  Pulse: (!) 59  Resp: 15  Temp: (!) 97.4 F (36.3 C)  TempSrc: Temporal  SpO2: 93%  Weight: 176 lb (79.8 kg)  Height: '5\' 7"'  (1.702 m)    Body mass index is 27.57 kg/m.  Physical Exam  BP 122/86   Pulse (!) 59   Temp (!) 97.4 F (36.3 C) (Temporal)   Resp 15   Ht '5\' 7"'  (1.702 m)   Wt 176 lb (79.8 kg)   LMP 01/13/2020   SpO2 93%   BMI 27.57 kg/m   General Appearance:    Alert, cooperative, no distress, appears stated age  Head:    Normocephalic, without obvious abnormality, atraumatic  Eyes:    PERRL, conjunctiva/corneas clear, EOM's intact, fundi  benign, both eyes  Ears:    Normal TM's and external ear canals, both ears  Nose:   Nares normal, septum midline, mucosa normal, no drainage    or sinus tenderness  Throat:   Lips, mucosa, and tongue normal; teeth and gums normal  Neck:   Supple, symmetrical, trachea midline, no adenopathy;    thyroid:  no enlargement/tenderness/nodules; no carotid   bruit or JVD  Back:     Symmetric, no curvature, ROM normal, no CVA tenderness  Lungs:     Clear to auscultation bilaterally, respirations unlabored  Chest Wall:    No tenderness or deformity   Heart:    Regular rate and rhythm, S1 and S2 normal, no murmur, rub   or gallop  Breast Exam:    No tenderness, masses, or nipple abnormality  Abdomen:     Soft, non-tender, bowel sounds active all four quadrants,    no masses, no organomegaly  Genitalia:    Normal external female genitalia, no palpable lymph nodes, no vaginal discharge, IUD strings visualized, no palpable ovarian masses, uterus midline and nontender.  Extremities:   Extremities normal, atraumatic, no cyanosis or edema  Pulses:   2+ and symmetric all extremities  Skin:   Skin color, texture, turgor normal, no rashes or lesions  Lymph nodes:   Cervical, supraclavicular, and axillary nodes normal  Neurologic:   CNII-XII intact, normal strength, sensation and reflexes    throughout       Assessment/Plan:   Patient was seen for a health maintenance exam.  Counseled the patient on health maintenance issues. Reviewed her health mainteance schedule and ordered appropriate tests (see orders.) Counseled on regular exercise and weight management. Recommend regular eye exams and dental cleaning.   The following issues were addressed today for health maintenance:   Arayna was seen today for annual exam.  Diagnoses and all orders for this visit:  Encounter for health maintenance examination in adult- Women's Health Maintenance Plan Advised monthly breast exam and annual mammogram Advised dental exam every six months Discussed stress management Discussed pap smear screening guidelines    Screening for cervical cancer- Discussed cervical cancer screening  As well as screening for HPV Discussed risk factors for cervical cancer  And hpv vaccination records reviewed  -     Cytology - PAP([place])    Return in about 1 year (around 01/26/2021) for PAP SMEAR.    Body mass index is 27.57 kg/m.:  Discussed the patient's BMI with patient. The BMI body mass index is 27.57 kg/m.     Future Appointments  Date Time Provider [place]  07/14/2020 12:30 PM [facility] [place] 1 [facility]    Patient Instructions       If you have lab work done today you will be contacted with your lab results within the next 2 weeks.  If you have not heard from [place] then please contact us. The fastest way to get your results is to register for My Chart.   IF you received an x-ray today, you will receive an invoice from [facility]. Please contact [facility] at [phone] with questions or concerns regarding your invoice.   IF you received labwork today, you will receive an invoice from [facility]. Please contact [facility] at [phone] with questions or concerns regarding your invoice.   Our billing staff will not be able to assist you with questions  regarding bills from these companies.  You will be contacted with the lab results as soon as  they are available. The fastest way to get your results is to activate your My Chart account. Instructions are located on the last page of this paperwork. If you have not heard from [place] regarding the results in 2 weeks, please contact this office.     Preventive Care 42-18 Years Old, Female Preventive care refers to visits with your health care provider and lifestyle choices that can promote health and wellness. This includes:  A yearly physical exam. This may also be called an annual well check.  Regular dental visits and eye exams.  Immunizations.  Screening for certain conditions.  Healthy lifestyle choices, such as eating a healthy diet, getting regular exercise, not using drugs or products that contain nicotine and tobacco, and limiting alcohol use. What can I expect for my preventive care visit? Physical exam Your health care provider will check your:  Height and weight. This may be used to calculate body mass index (BMI), which tells if you are at a healthy weight.  Heart rate and blood pressure.  Skin for abnormal spots. Counseling Your health care provider may ask you questions about your:  Alcohol, tobacco, and drug use.  Emotional well-being.  Home and relationship well-being.  Sexual activity.  Eating habits.  Work and work Statistician.  Method of birth control.  Menstrual cycle.  Pregnancy history. What immunizations do I need?  Influenza (flu) vaccine  This is recommended every year. Tetanus, diphtheria, and pertussis (Tdap) vaccine  You may need a Td booster every 10 years. Varicella (chickenpox) vaccine  You may need this if you have not been vaccinated. Human papillomavirus (HPV) vaccine  If recommended by your health care provider, you may need three doses over 6 months. Measles, mumps, and rubella (MMR) vaccine  You may need at least one dose of  MMR. You may also need a second dose. Meningococcal conjugate (MenACWY) vaccine  One dose is recommended if you are age 1-21 years and a first-year college student living in a residence hall, or if you have one of several medical conditions. You may also need additional booster doses. Pneumococcal conjugate (PCV13) vaccine  You may need this if you have certain conditions and were not previously vaccinated. Pneumococcal polysaccharide (PPSV23) vaccine  You may need one or two doses if you smoke cigarettes or if you have certain conditions. Hepatitis A vaccine  You may need this if you have certain conditions or if you travel or work in places where you may be exposed to hepatitis A. Hepatitis B vaccine  You may need this if you have certain conditions or if you travel or work in places where you may be exposed to hepatitis B. Haemophilus influenzae type b (Hib) vaccine  You may need this if you have certain conditions. You may receive vaccines as individual doses or as more than one vaccine together in one shot (combination vaccines). Talk with your health care provider about the risks and benefits of combination vaccines. What tests do I need?  Blood tests  Lipid and cholesterol levels. These may be checked every 5 years starting at age 69.  Hepatitis C test.  Hepatitis B test. Screening  Diabetes screening. This is done by checking your blood sugar (glucose) after you have not eaten for a while (fasting).  Sexually transmitted disease (STD) testing.  BRCA-related cancer screening. This may be done if you have a family history of breast, ovarian, tubal, or peritoneal cancers.  Pelvic exam and Pap test. This may be done every 3  years starting at age 54. Starting at age 34, this may be done every 5 years if you have a Pap test in combination with an HPV test. Talk with your health care provider about your test results, treatment options, and if necessary, the need for more  tests. Follow these instructions at home: Eating and drinking   Eat a diet that includes fresh fruits and vegetables, whole grains, lean protein, and low-fat dairy.  Take vitamin and mineral supplements as recommended by your health care provider.  Do not drink alcohol if: ? Your health care provider tells you not to drink. ? You are pregnant, may be pregnant, or are planning to become pregnant.  If you drink alcohol: ? Limit how much you have to 0-1 drink a day. ? Be aware of how much alcohol is in your drink. In the [place] one drink equals one 12 oz bottle of beer (355 mL), one 5 oz glass of wine (148 mL), or one 1 oz glass of hard liquor (44 mL). Lifestyle  Take daily care of your teeth and gums.  Stay active. Exercise for at least 30 minutes on 5 or more days each week.  Do not use any products that contain nicotine or tobacco, such as cigarettes, e-cigarettes, and chewing tobacco. If you need help quitting, ask your health care provider.  If you are sexually active, practice safe sex. Use a condom or other form of birth control (contraception) in order to prevent pregnancy and STIs (sexually transmitted infections). If you plan to become pregnant, see your health care provider for a preconception visit. What's next?  Visit your health care provider once a year for a well check visit.  Ask your health care provider how often you should have your eyes and teeth checked.  Stay up to date on all vaccines. This information is not intended to replace advice given to you by your health care provider. Make sure you discuss any questions you have with your health care provider. Document Revised: 07/17/2018 Document Reviewed: 07/17/2018 Elsevier Patient Education  2020 [employer].

## 2020-01-27 NOTE — Patient Instructions (Addendum)
   If you have lab work done today you will be contacted with your lab results within the next 2 weeks.  If you have not heard from us then please contact us. The fastest way to get your results is to register for My Chart.   IF you received an x-ray today, you will receive an invoice from [facility]. Please contact [facility] at [phone] with questions or concerns regarding your invoice.   IF you received labwork today, you will receive an invoice from [facility]. Please contact [facility] at [phone] with questions or concerns regarding your invoice.   Our billing staff will not be able to assist you with questions regarding bills from these companies.  You will be contacted with the lab results as soon as they are available. The fastest way to get your results is to activate your My Chart account. Instructions are located on the last page of this paperwork. If you have not heard from us regarding the results in 2 weeks, please contact this office.     Preventive Care 32-39 Years Old, Female Preventive care refers to visits with your health care provider and lifestyle choices that can promote health and wellness. This includes:  A yearly physical exam. This may also be called an annual well check.  Regular dental visits and eye exams.  Immunizations.  Screening for certain conditions.  Healthy lifestyle choices, such as eating a healthy diet, getting regular exercise, not using drugs or products that contain nicotine and tobacco, and limiting alcohol use. What can I expect for my preventive care visit? Physical exam Your health care provider will check your:  Height and weight. This may be used to calculate body mass index (BMI), which tells if you are at a healthy weight.  Heart rate and blood pressure.  Skin for abnormal spots. Counseling Your health care provider may ask you questions about your:  Alcohol, tobacco, and drug use.  Emotional  well-being.  Home and relationship well-being.  Sexual activity.  Eating habits.  Work and work environment.  Method of birth control.  Menstrual cycle.  Pregnancy history. What immunizations do I need?  Influenza (flu) vaccine  This is recommended every year. Tetanus, diphtheria, and pertussis (Tdap) vaccine  You may need a Td booster every 10 years. Varicella (chickenpox) vaccine  You may need this if you have not been vaccinated. Human papillomavirus (HPV) vaccine  If recommended by your health care provider, you may need three doses over 6 months. Measles, mumps, and rubella (MMR) vaccine  You may need at least one dose of MMR. You may also need a second dose. Meningococcal conjugate (MenACWY) vaccine  One dose is recommended if you are age 19-21 years and a first-year college student living in a residence hall, or if you have one of several medical conditions. You may also need additional booster doses. Pneumococcal conjugate (PCV13) vaccine  You may need this if you have certain conditions and were not previously vaccinated. Pneumococcal polysaccharide (PPSV23) vaccine  You may need one or two doses if you smoke cigarettes or if you have certain conditions. Hepatitis A vaccine  You may need this if you have certain conditions or if you travel or work in places where you may be exposed to hepatitis A. Hepatitis B vaccine  You may need this if you have certain conditions or if you travel or work in places where you may be exposed to hepatitis B. Haemophilus influenzae type b (Hib) vaccine  You   may need this if you have certain conditions. You may receive vaccines as individual doses or as more than one vaccine together in one shot (combination vaccines). Talk with your health care provider about the risks and benefits of combination vaccines. What tests do I need?  Blood tests  Lipid and cholesterol levels. These may be checked every 5 years starting at age  20.  Hepatitis C test.  Hepatitis B test. Screening  Diabetes screening. This is done by checking your blood sugar (glucose) after you have not eaten for a while (fasting).  Sexually transmitted disease (STD) testing.  BRCA-related cancer screening. This may be done if you have a family history of breast, ovarian, tubal, or peritoneal cancers.  Pelvic exam and Pap test. This may be done every 3 years starting at age 21. Starting at age 30, this may be done every 5 years if you have a Pap test in combination with an HPV test. Talk with your health care provider about your test results, treatment options, and if necessary, the need for more tests. Follow these instructions at home: Eating and drinking   Eat a diet that includes fresh fruits and vegetables, whole grains, lean protein, and low-fat dairy.  Take vitamin and mineral supplements as recommended by your health care provider.  Do not drink alcohol if: ? Your health care provider tells you not to drink. ? You are pregnant, may be pregnant, or are planning to become pregnant.  If you drink alcohol: ? Limit how much you have to 0-1 drink a day. ? Be aware of how much alcohol is in your drink. In the [place] one drink equals one 12 oz bottle of beer (355 mL), one 5 oz glass of wine (148 mL), or one 1 oz glass of hard liquor (44 mL). Lifestyle  Take daily care of your teeth and gums.  Stay active. Exercise for at least 30 minutes on 5 or more days each week.  Do not use any products that contain nicotine or tobacco, such as cigarettes, e-cigarettes, and chewing tobacco. If you need help quitting, ask your health care provider.  If you are sexually active, practice safe sex. Use a condom or other form of birth control (contraception) in order to prevent pregnancy and STIs (sexually transmitted infections). If you plan to become pregnant, see your health care provider for a preconception visit. What's next?  Visit your health  care provider once a year for a well check visit.  Ask your health care provider how often you should have your eyes and teeth checked.  Stay up to date on all vaccines. This information is not intended to replace advice given to you by your health care provider. Make sure you discuss any questions you have with your health care provider. Document Revised: 07/17/2018 Document Reviewed: 07/17/2018 Elsevier Patient Education  2020 Elsevier Inc.  

## 2020-01-28 LAB — CYTOLOGY - PAP
Chlamydia: NEGATIVE
Comment: NEGATIVE
Comment: NEGATIVE
Comment: NORMAL
Diagnosis: NEGATIVE
High risk HPV: NEGATIVE
Neisseria Gonorrhea: NEGATIVE

## 2020-02-24 ENCOUNTER — Ambulatory Visit: Payer: 59 | Attending: Internal Medicine

## 2020-07-14 ENCOUNTER — Other Ambulatory Visit: Payer: Self-pay | Admitting: Obstetrics and Gynecology

## 2020-07-14 ENCOUNTER — Ambulatory Visit
Admission: RE | Admit: 2020-07-14 | Discharge: 2020-07-14 | Disposition: A | Discharge status: Home / Self Care | Payer: 59 | Source: Ambulatory Visit | Attending: Obstetrics and Gynecology | Admitting: Obstetrics and Gynecology

## 2020-07-14 ENCOUNTER — Other Ambulatory Visit: Payer: Self-pay

## 2020-07-14 DIAGNOSIS — N6310 Unspecified lump in the right breast, unspecified quadrant: Secondary | ICD-10-CM

## 2020-08-10 ENCOUNTER — Ambulatory Visit: Payer: Self-pay | Admitting: Cardiovascular Disease

## 2021-01-16 ENCOUNTER — Other Ambulatory Visit: Payer: Self-pay

## 2021-01-16 ENCOUNTER — Other Ambulatory Visit: Payer: Managed Care, Other (non HMO)

## 2021-01-16 ENCOUNTER — Ambulatory Visit
Admission: RE | Admit: 2021-01-16 | Discharge: 2021-01-16 | Disposition: A | Discharge status: Home / Self Care | Payer: Managed Care, Other (non HMO) | Source: Ambulatory Visit | Attending: Obstetrics and Gynecology | Admitting: Obstetrics and Gynecology

## 2021-01-16 DIAGNOSIS — N6310 Unspecified lump in the right breast, unspecified quadrant: Secondary | ICD-10-CM

## 2021-04-14 ENCOUNTER — Ambulatory Visit: Payer: Managed Care, Other (non HMO) | Admitting: Audiologist

## 2021-05-08 ENCOUNTER — Other Ambulatory Visit: Payer: Self-pay

## 2021-05-08 ENCOUNTER — Ambulatory Visit: Payer: Managed Care, Other (non HMO) | Attending: Internal Medicine | Admitting: Audiologist

## 2021-05-08 DIAGNOSIS — H9311 Tinnitus, right ear: Secondary | ICD-10-CM | POA: Insufficient documentation

## 2021-05-08 NOTE — Procedures (Signed)
  Outpatient Audiology and [facility] [address] [place], [place]  [address] [phone]  AUDIOLOGICAL  EVALUATION  NAME: Kara Cherry     DOB:   [birth year]      MRN: [id number]                                                                                     DATE: 05/08/2021     REFERENT: Doristine Bosworth, MD STATUS: Outpatient DIAGNOSIS: Buzzing in Kara Ear, Normal Hearing   History: Kara Cherry , 35 y.o. , was seen for an audiological evaluation.  Kara Cherry is concerned due to a static sound in her right ear whenever exposed to loud sound, like a loud bass in music or child yelling. After exposure to these sounds Kara hearing in her right ear is distorted and sounds fuzzy then recovers. She has started having intermittent ringing last a few seconds at a time in her left ear. She has no pain in either ear or feeling of pressure. She does not notice any changes in her hearing. Kara Cherry said she had lots of ear infections as a child. There is no family history of hearing loss. Medical history negative for any warning signs for hearing loss. No other relevant case history reported.    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  Ipsilateral reflexes normal in right ear 500-4k Hz and did not elicit Kara static reaction.  Ipsilateral reflexes elevated in left ear 500-4k Hz likely due to high compliance of tympanic membrane.  Audiometric testing was completed using Conventional Audiometry techniques over insert transducer. Test results are consistent with normal hearing 250-8k Hz in both ears. Speech detection thresholds 10dB in Kara right ear and 10dB in Kara left ear. Word recognition with a Nu6 list was good in both ears at 40dB SL.    Results:  Kara test results were reviewed with  Kara Cherry.  Hearing is normal in both ears. There is no indication of hearing loss at this time. Static sound in Kara right ear with loud sound is likely due to adverse reaction to loud sound exposure. If symptoms change or she starts having pain, recommend follow up with an audiologist and having a medical evaluation with an ear nose and throat specialist.    Recommendations: 1.   No further audiologic testing is needed unless future hearing concerns arise.    Kara Cherry  Audiologist, Au.D., CCC-A

## 2021-05-19 IMAGING — MG DIGITAL DIAGNOSTIC BILAT W/ TOMO W/ CAD
7 of 12 series · 7 of 36 positions shown · non-contrast
Comparison: Previous exam(s).
COMPARISON: None.

*** End of Addendum ***
COMPARISON: Previous exam(s).

Addendum:
CLINICAL DATA: 33-year-old female presenting with a palpable area
of concern in the inferior right breast.

EXAM:
DIGITAL DIAGNOSTIC BILATERAL MAMMOGRAM WITH TOMO
ULTRASOUND RIGHT BREAST

[L MLO synth-2D]
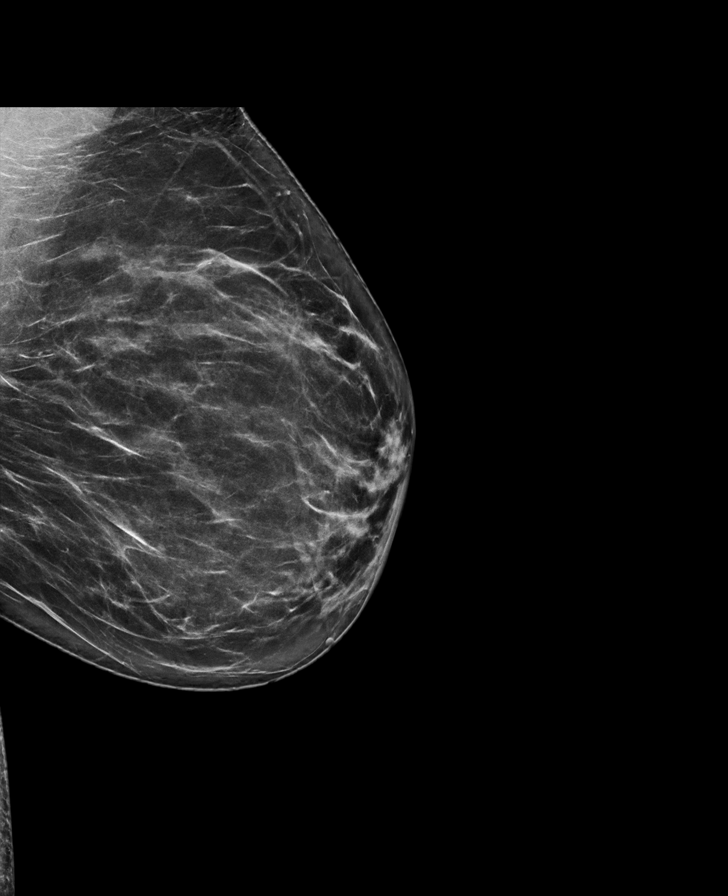

[R ML synth-2D]
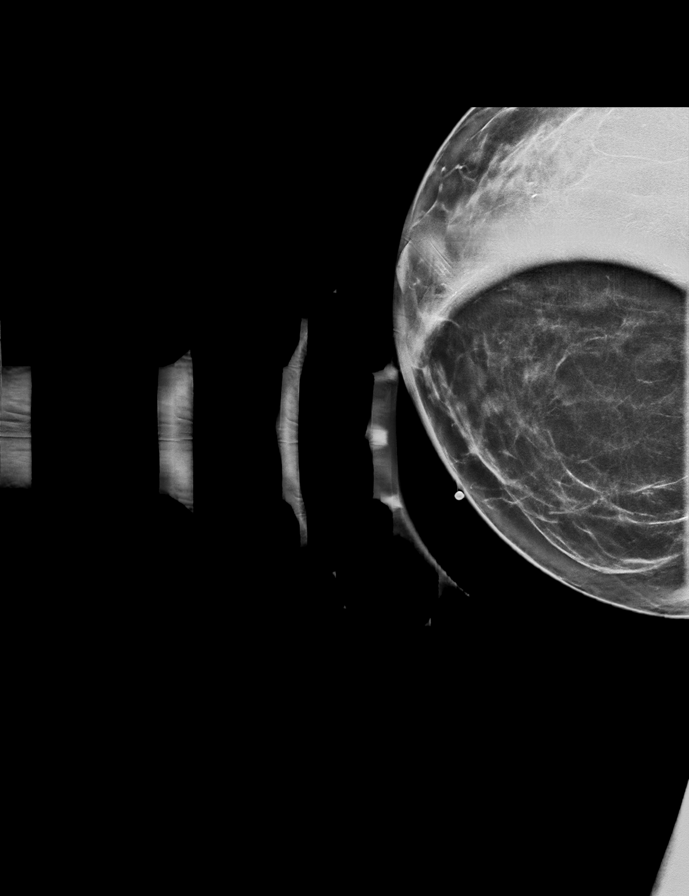

[L CC synth-2D]
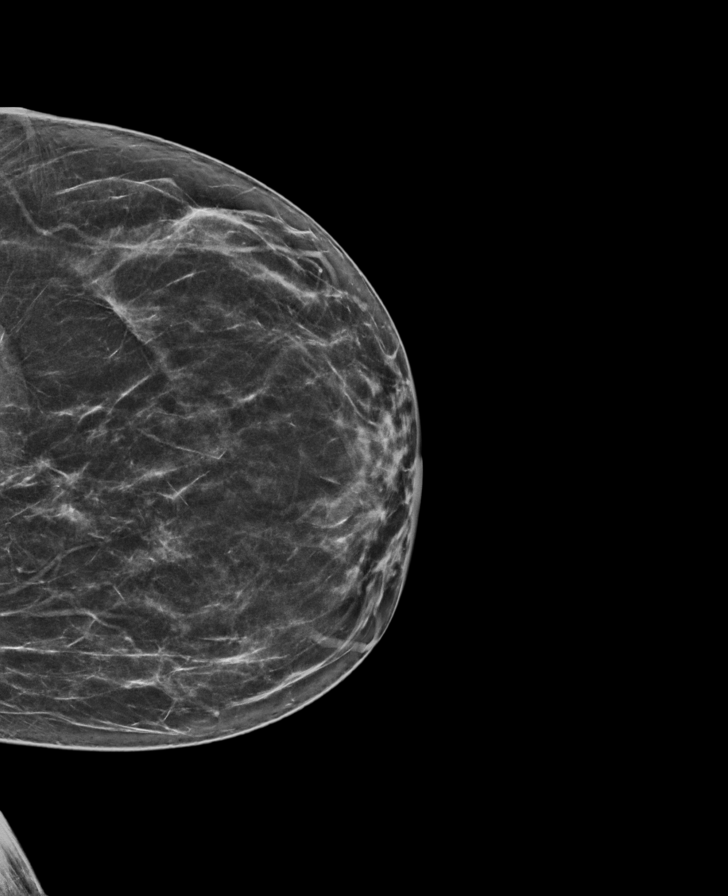

[R MLO synth-2D]
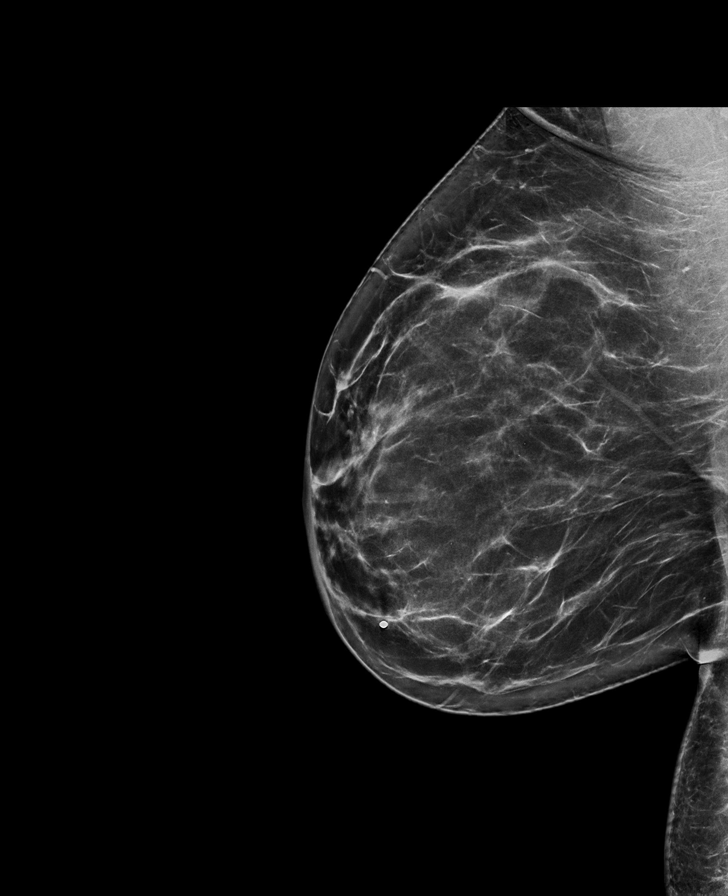

[R CC synth-2D (1 of 2)]
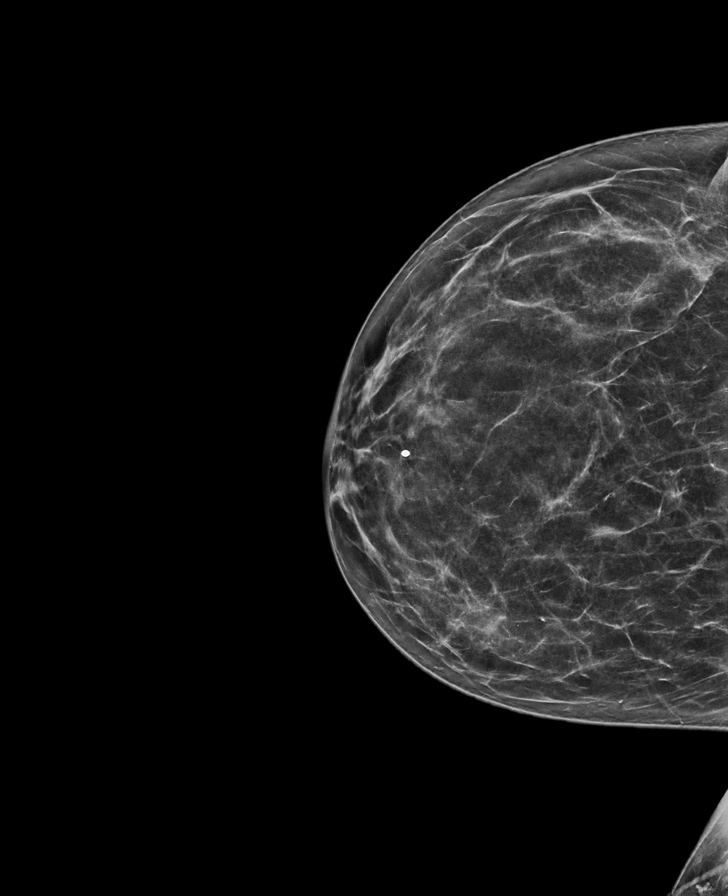

[R CC synth-2D (2 of 2)]
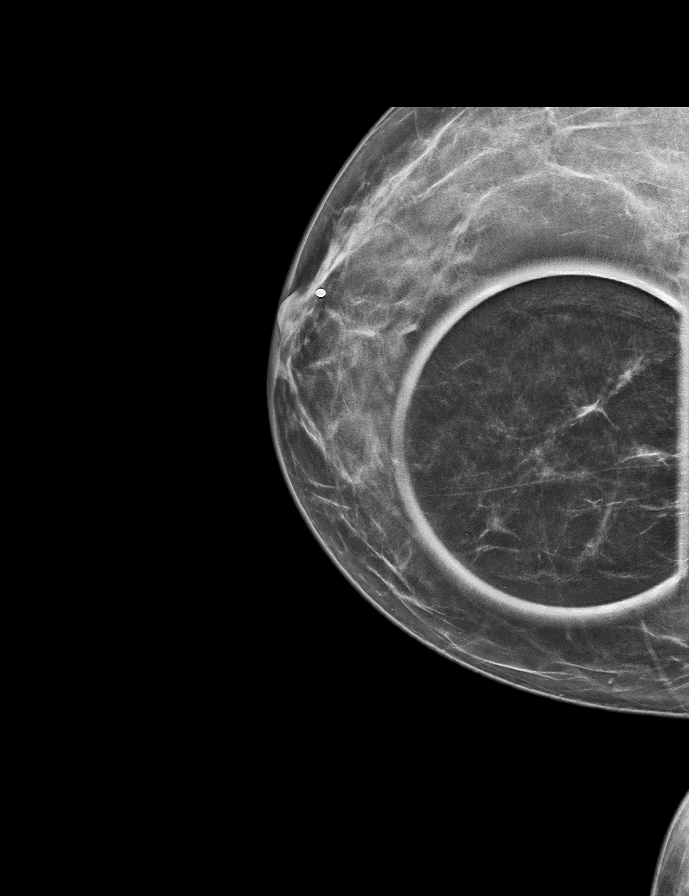

[L CC tomo · tomo slice 35/68.0]
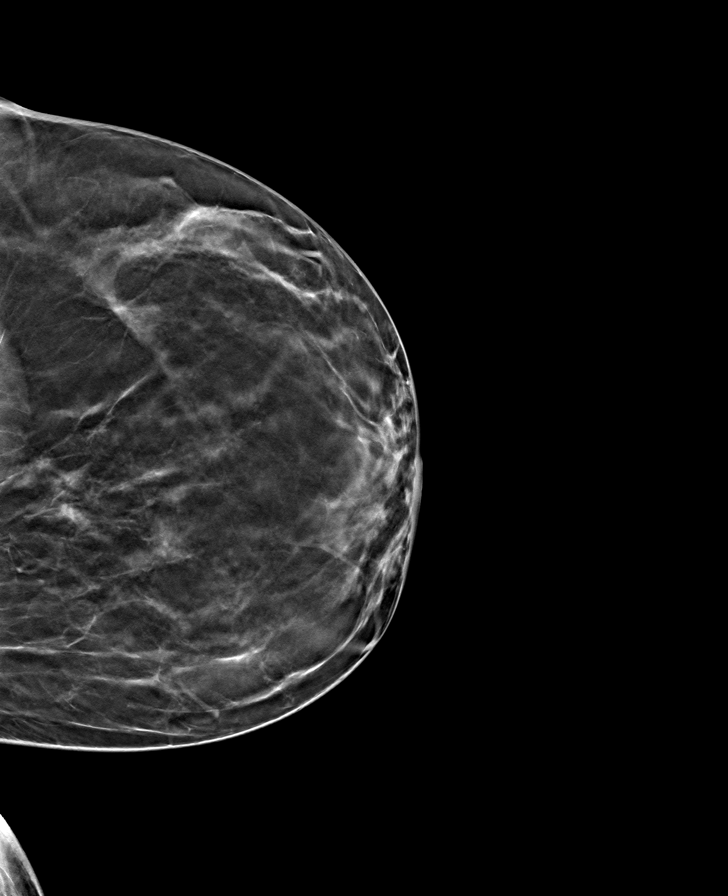

[7 of 36 positions shown; findings below may reference images not displayed]

ACR Breast Density Category b: There are scattered areas of
fibroglandular density.
FINDINGS: Mammogram:

Right breast: A skin BB marks the palpable site of concern in the
inferior right breast. There is no abnormality identified at this
site. However in the upper inner aspect of the right breast there is
an oval circumscribed mass measuring 0.7 cm.

Left breast: No suspicious mass, distortion, or microcalcifications
are identified to suggest presence of malignancy.

On physical exam at the palpable site of concern in the inferior
right breast I palpate mildly heterogeneous breast parenchyma
without a discrete fixed mass.

Ultrasound:

Targeted ultrasound is performed in the upper inner quadrant of the
right breast at 1 o'clock 4 cm from the nipple demonstrating an oval
circumscribed hypoechoic mass measuring 0.7 by 0.3 x 0.4 cm. No
internal blood flow identified. This corresponds to the mammographic
finding. Targeted ultrasound is also performed in the right breast
at the palpable site of concern at 6 o'clock 3 cm from the nipple
demonstrating no suspicious cystic or solid mass.
IMPRESSION: 1. Right breast mass at 1 o'clock measuring 0.7 cm is probably
benign and likely represents a fibroadenoma or complicated cyst.

2. No mammographic or sonographic evidence of malignancy at the
palpable site of concern in the right breast.

3.  No mammographic evidence of malignancy in the left breast.

RECOMMENDATION:
Right breast ultrasound in 6 months to evaluate the probably benign
mass at 1 o'clock.

I have discussed the findings and recommendations with the patient.
If applicable, a reminder letter will be sent to the patient
regarding the next appointment.

BI-RADS CATEGORY  3: Probably benign.

ADDENDUM:
ACR Breast Density Category b: There are scattered areas of
fibroglandular density.
FINDINGS: Mammogram:

Right breast: A skin BB marks the palpable site of concern in the
inferior right breast. There is no abnormality identified at this
site. However in the upper inner aspect of the right breast there is
an oval circumscribed mass measuring 0.7 cm.

Left breast: No suspicious mass, distortion, or microcalcifications
are identified to suggest presence of malignancy.

On physical exam at the palpable site of concern in the inferior
right breast I palpate mildly heterogeneous breast parenchyma
without a discrete fixed mass.

Ultrasound:

Targeted ultrasound is performed in the upper inner quadrant of the
right breast at 1 o'clock 4 cm from the nipple demonstrating an oval
circumscribed hypoechoic mass measuring 0.7 by 0.3 x 0.4 cm. No
internal blood flow identified. This corresponds to the mammographic
finding. Targeted ultrasound is also performed in the right breast
at the palpable site of concern at 6 o'clock 3 cm from the nipple
demonstrating no suspicious cystic or solid mass.
IMPRESSION: 1. Right breast mass at 1 o'clock measuring 0.7 cm is probably
benign and likely represents a fibroadenoma or complicated cyst.

2. No mammographic or sonographic evidence of malignancy at the
palpable site of concern in the right breast.

3.  No mammographic evidence of malignancy in the left breast.

RECOMMENDATION:
Right breast ultrasound in 6 months to evaluate the probably benign
mass at 1 o'clock.

I have discussed the findings and recommendations with the patient.
If applicable, a reminder letter will be sent to the patient
regarding the next appointment.

BI-RADS CATEGORY  3: Probably benign.

## 2021-09-14 ENCOUNTER — Ambulatory Visit: Payer: Managed Care, Other (non HMO) | Admitting: Family

## 2021-11-21 ENCOUNTER — Other Ambulatory Visit: Payer: Self-pay | Admitting: Nurse Practitioner

## 2021-11-21 ENCOUNTER — Other Ambulatory Visit: Payer: Self-pay | Admitting: Obstetrics and Gynecology

## 2021-11-21 DIAGNOSIS — Z09 Encounter for follow-up examination after completed treatment for conditions other than malignant neoplasm: Secondary | ICD-10-CM

## 2021-11-21 DIAGNOSIS — N6310 Unspecified lump in the right breast, unspecified quadrant: Secondary | ICD-10-CM

## 2022-01-17 ENCOUNTER — Other Ambulatory Visit: Payer: Managed Care, Other (non HMO)

## 2022-01-18 ENCOUNTER — Other Ambulatory Visit: Payer: Self-pay | Admitting: Nurse Practitioner

## 2022-01-18 DIAGNOSIS — Z09 Encounter for follow-up examination after completed treatment for conditions other than malignant neoplasm: Secondary | ICD-10-CM

## 2022-01-18 DIAGNOSIS — N6310 Unspecified lump in the right breast, unspecified quadrant: Secondary | ICD-10-CM

## 2022-01-22 ENCOUNTER — Ambulatory Visit
Admission: RE | Admit: 2022-01-22 | Discharge: 2022-01-22 | Disposition: A | Discharge status: Home / Self Care | Payer: Managed Care, Other (non HMO) | Source: Ambulatory Visit | Attending: Nurse Practitioner | Admitting: Nurse Practitioner

## 2022-01-22 DIAGNOSIS — Z09 Encounter for follow-up examination after completed treatment for conditions other than malignant neoplasm: Secondary | ICD-10-CM

## 2022-01-22 DIAGNOSIS — N6310 Unspecified lump in the right breast, unspecified quadrant: Secondary | ICD-10-CM

## 2022-05-23 ENCOUNTER — Other Ambulatory Visit: Payer: Self-pay

## 2022-05-23 NOTE — Telephone Encounter (Signed)
FYI.  Not a pt at this practice. Prescriber listed on paper request is Tish Frederickson, NP  Believe that she is now located @ [facility].   Will refuse and provide them with their fax # to try.

## 2023-08-01 ENCOUNTER — Other Ambulatory Visit: Payer: Self-pay | Admitting: Nurse Practitioner

## 2023-08-01 DIAGNOSIS — Z1231 Encounter for screening mammogram for malignant neoplasm of breast: Secondary | ICD-10-CM

## 2024-11-04 ENCOUNTER — Other Ambulatory Visit: Payer: Self-pay | Admitting: Gastroenterology

## 2024-11-04 DIAGNOSIS — R1011 Right upper quadrant pain: Secondary | ICD-10-CM

## 2024-11-17 ENCOUNTER — Ambulatory Visit
Admission: RE | Admit: 2024-11-17 | Discharge: 2024-11-17 | Disposition: A | Discharge status: Home / Self Care | Source: Ambulatory Visit | Attending: Gastroenterology | Admitting: Gastroenterology

## 2024-11-17 DIAGNOSIS — R1011 Right upper quadrant pain: Secondary | ICD-10-CM
